# Patient Record
Sex: Female | Born: 1985 | Race: White | Hispanic: No | Marital: Married | Smoking: Never smoker
Health system: Southern US, Community
[De-identification: ages and names within clinical notes are randomized; demographics above are authoritative.]

## PROBLEM LIST (undated history)

## (undated) DIAGNOSIS — J382 Nodules of vocal cords: Secondary | ICD-10-CM

## (undated) DIAGNOSIS — Z8751 Personal history of pre-term labor: Secondary | ICD-10-CM

## (undated) DIAGNOSIS — K219 Gastro-esophageal reflux disease without esophagitis: Secondary | ICD-10-CM

## (undated) DIAGNOSIS — Z789 Other specified health status: Secondary | ICD-10-CM

## (undated) HISTORY — PX: TONSILLECTOMY: SUR1361

## (undated) HISTORY — DX: Nodules of vocal cords: J38.2

## (undated) HISTORY — PX: ADENOIDECTOMY: SUR15

## (undated) HISTORY — DX: Gastro-esophageal reflux disease without esophagitis: K21.9

---
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
## 2009-07-25 DIAGNOSIS — Z0000 Encounter for general adult medical examination without abnormal findings: Secondary | ICD-10-CM

## 2009-07-25 HISTORY — DX: Encounter for general adult medical examination without abnormal findings: Z00.00

## 2010-10-22 ENCOUNTER — Emergency Department
Admit: 2010-10-22 | Disposition: A | Discharge status: Home / Self Care | Payer: Self-pay | Source: Emergency Department | Admitting: Emergency Medicine

## 2010-10-29 ENCOUNTER — Emergency Department
Admit: 2010-10-29 | Disposition: A | Discharge status: Home / Self Care | Payer: Self-pay | Source: Emergency Department | Admitting: Emergency Medicine

## 2010-11-25 ENCOUNTER — Emergency Department
Admit: 2010-11-25 | Disposition: A | Discharge status: Home / Self Care | Payer: Self-pay | Source: Emergency Department | Admitting: Emergency Medicine

## 2012-03-20 ENCOUNTER — Encounter (INDEPENDENT_AMBULATORY_CARE_PROVIDER_SITE_OTHER): Payer: Self-pay | Admitting: Internal Medicine

## 2012-03-20 ENCOUNTER — Ambulatory Visit: Payer: Commercial Managed Care - PPO | Attending: Internal Medicine

## 2012-03-20 ENCOUNTER — Ambulatory Visit (INDEPENDENT_AMBULATORY_CARE_PROVIDER_SITE_OTHER): Payer: Commercial Managed Care - PPO | Admitting: Internal Medicine

## 2012-03-20 VITALS — BP 121/78 | HR 67 | Temp 98.3°F | Ht 62.0 in | Wt 137.0 lb

## 2012-03-20 DIAGNOSIS — K219 Gastro-esophageal reflux disease without esophagitis: Secondary | ICD-10-CM | POA: Insufficient documentation

## 2012-03-20 DIAGNOSIS — R1310 Dysphagia, unspecified: Secondary | ICD-10-CM

## 2012-03-20 MED ORDER — SUCRALFATE 1 G PO TABS
1.00 g | ORAL_TABLET | Freq: Four times a day (QID) | ORAL | Status: AC
Start: 2012-03-20 — End: 2013-03-20

## 2012-03-20 MED ORDER — ESOMEPRAZOLE MAGNESIUM 40 MG PO CPDR
40.00 mg | DELAYED_RELEASE_CAPSULE | Freq: Every morning | ORAL | Status: AC
Start: 2012-03-20 — End: 2013-03-20

## 2012-03-20 NOTE — Patient Instructions (Signed)
GERD (Adult)    The esophagus is a tube that carries food from the mouth to the stomach. A valve at the lower end of the esophagus prevents stomach acid from flowing upward. If this valve does not work properly, acid from the stomach enters the esophagus. If this occurs over and over, the acid will injure the lining of the esophagus.  This condition is called GERD (gastroesophageal reflux disease) or acid reflux. When stomach acid flows upward into the esophagus, it causes burning, pressure or sharp pain in the upper abdomen or mid to lower chest. The pain can spread to the neck, back, or shoulder, similar to heart pain (angina). There may be belching, an acid taste in the back of the throat, chronic cough, or sore throat or hoarseness. GERD symptoms often occur during the day after a big meal, but it can also occur at night when lying down. Smoking,as well as drinking alcohol, increases the risk of GERD.  GERD is a chronic condition. Once it begins, it is often lifelong. Treatment includes changes in eating habits and the use of acid blocker medications to decrease the amount of acid in the stomach.  Symptoms often improve with treatment, but if treatment is stopped, the symptoms usually return after a few months. So most persons with GERD will need to continue treatment.  HOME CARE:   Take the prescribed acid blocker medication for the full course of treatment even if you begin to feel better sooner. This medication can take up to several days to fully control your symptoms. If you can't afford the prescribed medication, you can try over-the-counter acid blockers, such as Pepcid AC, Tagamet, Zantac, or Aciphex. If these do not relieve your symptoms, a stronger acid-blocker can be tried, such as Prilosec OTC.   You can use antacids, such as Tums, Rolaids, Mylanta, or Maalox, for pain. This will be useful the first few days after starting acid blockers when the blockers haven't started working yet. Follow the  directions on the label. Liquid antacids may work better than tablets. Note that antacids can interfere with absorption of certain medications. Specifically, do not take Tagamet (cimetidine), Zantac (ranitidine), or Carafate (sucralfate) within 1 hour of taking an antacid. Talk with your pharmacist if you have any questions.   Limit or avoid fatty, fried, and spicy foods, as well as coffee, chocolate, mint, and foods with high acid content such as tomatoes and citrus fruit and juices (orange, grapefruit, lemon).   Avoid alcohol and smoking.   Don't eat large meals, especially at night. Frequent, smaller meals are best. Do not lie down right after eating. And don't eat anything 3 hours before going to bed.   If you are overweight, losing weight will reduce symptoms. Women should not wear corsets or girdles because this increases pressure on the stomach and worsens reflux.   If your symptoms occur during sleep, use a foam wedge to elevate your upper body (not just your head.) Or, place 4" blocks under the head of your bed.  FOLLOW UP with your doctor or as advised by our staff. Further testing may be needed. If you do not begin to improve over the next 4 days, contact your doctor. If you had an x-ray, CT scan, or ECG (electrocardiogram), it will be reviewed by a specialist. You'll be notified of any new findings that affect your care.  GET PROMPT MEDICAL ATTENTION if any of the following occur:   Stomach pain gets worse or moves to the  lower right abdomen (appendix area)   Chest pain appears or gets worse, or spreads to the back, neck, shoulder, or arm   Frequent vomiting (can't keep down liquids)   Blood in the stool or vomit (red or black in color)   Feeling weak or dizzy, fainting, or trouble breathing   Fever of 100.76F (38C) or higher, or as directed by your healthcare provider   [address], [address], [place], [place] [address]. All rights reserved. This information is not  intended as a substitute for professional medical care. Always follow your healthcare professional's instructions.

## 2012-03-20 NOTE — Progress Notes (Signed)
1. Have you self referred yourself since we last saw you? Yes, Dr Ned Card, OB/GYN.

## 2012-03-20 NOTE — Progress Notes (Signed)
Subjective:       Patient ID: [name] is a 26 y.o. female.    HPI Comments: Patient presents with:    Chest Pain - "tightness" when drinking/eating, x 2 weeks      The patient is a 26 years old female with a past medical history significant for GERD which started about 10 years ago and has been seen by gastroenterologist at that time in [place], [place] and had EGD done, she was treated with Nexium off and on until about the 2 years ago when she was slowly taken off of it and was doing fairly well.  According to patient she was also seen by ENT specialist at that time due to hoarseness of voice and noted to have a few nodule on the vocal cords but did not have to remove it and it was thought that her symptoms are probably due to severe reflux.     Nausea complaints of having difficulty with swallowing solid food like eating a sandwich over the last few weeks, Which has been slowly worsening and feels like food getting stuck behind the breast bone, there is no complaint of having any worsening heartburn over the last few months, having any problem at night, sour  taste in the mouth, Bloating sensations, belching or coughing up anything.  She occasionally complains of feeling tightness with drinking cold water or eating ice cream.   She has not lost any weight in states he has gained a few more pounds.   She denies drinking any alcohol or smoking or taking any over-the-counter NSAIDs, does not drink coffee or caffeinated drinks or orange juice.     She has not started taking her Nexium.   She denies any increased stress at work lately      The following portions of the patient's history were reviewed and updated as appropriate:   Past Medical History   Diagnosis Date   . Physical exam, annual 2011   . Vocal cord nodules        Past Surgical History   Procedure Date   . Esophagogastroduodenoscopy 2004     [place],        History     Social History   . Marital Status: Married     Spouse Name: N/A     Number of  Children: N/A   . Years of Education: N/A     Occupational History   . Not on file.     Social History Main Topics   . Smoking status: Never Smoker    . Smokeless tobacco: Never Used   . Alcohol Use: 1.0 oz/week     2 drink(s) per week   . Drug Use: No   . Sexually Active: Not on file     Other Topics Concern   . Not on file     Social History Narrative   . No narrative on file       Family History   Problem Relation Age of Onset   . Diabetes Father    . Hypertension Father    . Alcohol abuse Neg Hx    . Arthritis Neg Hx    . Asthma Neg Hx    . Birth defects Neg Hx    . Cancer Neg Hx    . COPD Neg Hx    . Depression Neg Hx    . Drug abuse Neg Hx    . Early death Neg Hx    . Hearing loss Neg  Hx    . Heart disease Neg Hx    . Hyperlipidemia Neg Hx    . Kidney disease Neg Hx    . Learning disabilities Neg Hx    . Mental retardation Neg Hx    . Miscarriages / Stillbirths Neg Hx    . Stroke Neg Hx    . Mental illness Neg Hx    . Vision loss Neg Hx        Allergies   Allergen Reactions   . Bactrim (Sulfamethoxazole W/Trimethoprim (Co-Trimoxazole)) Hives       Current Outpatient Prescriptions   Medication Status Sig Dispense Refill   . esomeprazole (NEXIUM) 40 MG capsule Active Take 1 capsule (40 mg total) by mouth every morning before breakfast.  30 capsule  2   . sucralfate (CARAFATE) 1 G tablet Active Take 1 tablet (1 g total) by mouth 4 (four) times daily.  120 tablet  2   . TRI-SPRINTEC 0.18/0.215/0.25 MG-35 MCG TABS Active            BP 121/78  Pulse 67  Temp(Src) 98.3 F (36.8 C) (Oral)  Ht 1.575 m (5\' 2" )  Wt 62.143 kg (137 lb)  BMI 25.06 kg/m2  LMP 03/19/2012  .    Review of Systems   Constitutional: Negative.    HENT: Negative.    Eyes: Negative.    Respiratory: Negative.    Cardiovascular: Negative for palpitations.        As per H&P   Gastrointestinal: Negative for nausea, abdominal pain, constipation, blood in stool and abdominal distention.        As per H&P   Genitourinary: Negative.     Musculoskeletal: Negative.    Skin: Negative.    Neurological: Negative.    Psychiatric/Behavioral: Negative.            Objective:    Physical Exam   Constitutional: She is oriented to person, place, and time. She appears well-developed and well-nourished. No distress.   HENT:   Head: Atraumatic.   Mouth/Throat: Oropharynx is clear and moist. No oropharyngeal exudate.   Eyes: Conjunctivae normal and EOM are normal. Pupils are equal, round, and reactive to light. No scleral icterus.   Neck: Normal range of motion. Neck supple. No JVD present. No tracheal deviation present. No thyromegaly present.   Cardiovascular: Normal rate, regular rhythm, normal heart sounds and intact distal pulses.    No murmur heard.  Pulmonary/Chest: Effort normal and breath sounds normal. No stridor. No respiratory distress. She has no wheezes. She has no rales. She exhibits no tenderness.   Abdominal: Soft. Bowel sounds are normal. She exhibits no distension and no mass. There is no tenderness. There is no rebound and no guarding.        Protuberant, no guarding or rigidity, no hepatosplenomegaly, no hernia   Genitourinary:        Not done as per patient   Musculoskeletal: She exhibits no edema and no tenderness.   Lymphadenopathy:     She has no cervical adenopathy.   Neurological: She is alert and oriented to person, place, and time. She has normal reflexes.   Skin: Skin is warm and dry.   Psychiatric: She has a normal mood and affect. Her behavior is normal. Judgment and thought content normal.         No previous records available  for review      Assessment:       1. Odynophagia  CBC and differential, Comprehensive metabolic  panel, XR Chest 2 Views, FL Modified Barium Swallow W Speech Therapy, sucralfate (CARAFATE) 1 G tablet, esomeprazole (NEXIUM) 40 MG capsule   2. GERD (gastroesophageal reflux disease)  CBC and differential, Comprehensive metabolic panel, XR Chest 2 Views, FL Modified Barium Swallow W Speech Therapy, sucralfate  (CARAFATE) 1 G tablet, esomeprazole (NEXIUM) 40 MG capsule           Plan:    We had a long discussion with patient and it appears that her symptoms are probably due to reflux esophagitis and either spasm or stricture.   We will resume her Nexium and also add Carafate 1 g before each meal.   We will check CBC and comprehensive metabolic panel.  We will check chest x-ray to rule out any other etiology.   We have given option to patient to be seen by gastroenterologist and have EGD done and if there is any stricture and can be dilated at the same time or to barium swallow study to evaluate if the stricture versus spasms.    She decided to go with the barium swallow study due to which we will check it.  She will followup in 2 weeks and if her symptoms are not any better then she will need EGD.   We have also discussed diet with patient at length and she will avoid any citric juices, caffeinated drinks or carbonated drinks, chocolate, and NSAID.   We have also advised patient if she does eat sandwich that she needs to remove the hard part of bread ( Edges)

## 2012-03-21 LAB — CBC AND DIFFERENTIAL
Atypical Lymphocytes %: 0 %
Baso(Absolute): 30 cells/uL (ref 0–200)
Basophils: 0.3 %
Eosinophils Absolute: 90 cells/uL (ref 15–500)
Eosinophils: 0.9 %
Hematocrit: 38.9 % (ref 35.0–45.0)
Hemoglobin: 13 g/dL (ref 11.7–15.5)
Lymphocytes Absolute: 2100 cells/uL (ref 850–3900)
Lymphocytes: 21 %
MCH: 30.3 pg (ref 27–33)
MCHC: 33.3 g/dL (ref 32–36)
MCV: 91 fL (ref 80–100)
MPV: 7.7 fL (ref 7.5–11.5)
Monocytes Absolute: 310 cells/uL (ref 200–950)
Monocytes: 3.1 %
Neutrophils Absolute: 7470 cells/uL (ref 1500–7800)
Neutrophils: 74.7 %
Platelets: 338 10*3/uL (ref 140–400)
RBC: 4.28 10*6/uL (ref 3.80–5.10)
RDW: 13.4 % (ref 11.0–15.0)
WBC: 10 10*3/uL (ref 3.8–10.8)

## 2012-03-21 LAB — COMPREHENSIVE METABOLIC PANEL
ALT: 25 U/L (ref 6–29)
AST (SGOT): 27 U/L (ref 10–30)
Albumin/Globulin Ratio: 1.7 (ref 1.0–2.5)
Albumin: 4.5 G/DL (ref 3.6–5.1)
Alkaline Phosphatase: 68 U/L (ref 33–115)
BUN: 12 MG/DL (ref 7–25)
Bilirubin, Total: 0.6 MG/DL (ref 0.2–1.2)
CO2: 20 mmol/L (ref 19–30)
Calcium: 9.3 MG/DL (ref 8.6–10.2)
Chloride: 103 mmol/L (ref 98–110)
Creatinine: 0.73 mg/dL (ref 0.50–1.10)
EGFR African American: 130 mL/min/{1.73_m2} (ref >=60)
EGFR: 112 mL/min/{1.73_m2} (ref >=60)
Globulin: 2.7 G/DL (ref 1.9–3.7)
Glucose: 78 MG/DL (ref 65–99)
Potassium: 4.1 mmol/L (ref 3.5–5.3)
Protein, Total: 7.2 G/DL (ref 6.1–8.1)
Sodium: 135 mmol/L (ref 135–146)

## 2012-03-29 ENCOUNTER — Telehealth (INDEPENDENT_AMBULATORY_CARE_PROVIDER_SITE_OTHER): Payer: Self-pay

## 2012-03-29 NOTE — Telephone Encounter (Signed)
Dr. Allena Katz authorized Prilosec 20mg  po qd #30 R 2, called into pt's insurance and preferred pharmacy

## 2012-03-30 ENCOUNTER — Ambulatory Visit
Admission: RE | Admit: 2012-03-30 | Discharge: 2012-03-30 | Disposition: A | Discharge status: Home / Self Care | Payer: Exclusive Provider Organization | Source: Ambulatory Visit | Attending: Internal Medicine | Admitting: Internal Medicine

## 2012-03-30 DIAGNOSIS — K219 Gastro-esophageal reflux disease without esophagitis: Secondary | ICD-10-CM | POA: Insufficient documentation

## 2012-03-30 MED ORDER — BARIUM SULFATE 60 % PO SUSP
20.00 mL | Freq: Once | ORAL | Status: AC | PRN
Start: 2012-03-30 — End: 2012-03-30
  Administered 2012-03-30: 20 mL via ORAL

## 2012-03-30 MED ORDER — BARIUM SULFATE 40% ORAL SUSPENSION
10.00 mL | Freq: Once | Status: AC | PRN
Start: 2012-03-30 — End: 2012-03-30
  Administered 2012-03-30: 10 mL via ORAL

## 2012-03-30 NOTE — SLP Eval Note (Signed)
[facility]  Speech and Language Therapy Evaluation     Videofluoroscopic Swallowing Study Report    Patient: [name]    MRN#: [id number]     Time Calculation:  Time In: 0930  Time Out: 1000      Referring Physician: Dr Allena Katz  Radiologist: Dr Jannetta Quint      Medical Diagnosis: Odynophagia [787.20]  GERD (gastroesophageal reflux disease) [530.81]    Chief Complaint: Globus sensation and dysphagia with solid. Pt reported feeling the food get stuck in the throat and the chest.    Past Medical/Surgical History:  Past Medical History   Diagnosis Date   . Physical exam, annual 2011   . Vocal cord nodules    . GERD (gastroesophageal reflux disease)       Past Surgical History   Procedure Date   . Esophagogastroduodenoscopy 2004     [place],          Behavior/Mental Status: Alert / oriented    Risks/benefits of evaluation discussed with patient/caregiver? Yes    Respiratory Status: WNL's    Nutrition: Oral    Diet prior to VFSS: Regular    Liquids Consistency: Thin liquids    Oral Motor Skills:   Impairments: WNL's     VFSS Results - Clinical Observations    Positioning: Upright    Plane of Reference: lateral    Presentations:      Liquids:   Thin:20 cc   Nectar:   Honey:   1 teaspoon rice cereal per oz liquid:   2 teaspoon rice cereal per oz liquid:   1 tablespoon rice cereal per oz liquid:     Puree: 5 tsp  Solid: 3 bites  Pill:  Other:     Oral Stage: WNL's    Pharyngeal Stage: Mild pharyngeal stasis    Esophageal Stage: Not assessed at this time    Airway Protection: WNL's    Compensatory Strategies Assessed: N/A    Patient/Family Education: completed        Assessment:     Pt presents with mild pharyngeal dysphagia characterized by mild reduced tongue base retraction yielding min residue at the level of the Valleculae. There was adequate Hyo-Laryngeal excursion along with optimal pharyngeal peristalsis. There was adequate Epiglottic retroflexion with every swallow. There was mild delay in Cricopharyngeal  opening ( suggestive of possible esophageal motility involvement) Mild trace of residue at the level of pyriform sinuses. Study revealed no aspiration or penetration with solid and thin liquids. If clinically warranted, Pt may need upper GI study ( esophagram and/ or EGD to further examine esophageal stage of swallow and determine the etiology of globus sensation and dysphagia with solid.           Plan:   Discussed with: Patient   Suggestions for feeding: upright   Suggestions for medication administration: whole pill / liquid assist   Diet Recommendations: regular   Liquid Recommendations: Thin liquids   Mode for liquids: straw/ cup   Precautions/Compensations: Safe swallowing precautions education, small bites and alternate solid and liquids for optimal pharyngeal clearance.    Presley Raddle MS [facility] SLP   03/30/2012 2:25 PM  [phone]     03/30/2012

## 2012-04-04 ENCOUNTER — Ambulatory Visit (INDEPENDENT_AMBULATORY_CARE_PROVIDER_SITE_OTHER): Payer: Commercial Managed Care - PPO | Admitting: Internal Medicine

## 2012-04-13 ENCOUNTER — Ambulatory Visit (INDEPENDENT_AMBULATORY_CARE_PROVIDER_SITE_OTHER): Payer: Self-pay | Admitting: Internal Medicine

## 2012-07-25 NOTE — L&D Delivery Note (Signed)
Delivery Note:    NSVD of live birth female  infant over intact perineum,  Weight 4 lb 2 oz (1871 g)     APGARs:   1 minute: 9    5 minute: 9   10 minute:     Infant grossly normal.  nuchal       Placenta - spontaneously delivered, intact, 3 vessels     Lacerations - Yes , Periurethral;1st , injected with xylocaine, no repair needed    Complications - none    EBL: 200cc    Anesthesia - Local     Patient tolerated procedure well.    Waldon Merl, MD  06/27/2013  10:51 PM

## 2013-01-23 LAB — GONOCOCCUS CULTURE
Chlamydia trachomatis Culture: NEGATIVE
Culture Gonorrhoeae: NEGATIVE

## 2013-01-23 LAB — ANTIBODY SCREEN: AB Screen Gel: NEGATIVE

## 2013-01-23 LAB — HEPATITIS B SURFACE ANTIGEN W/ REFLEX TO CONFIRMATION: Hepatitis B Surface Antigen: NEGATIVE

## 2013-01-23 LAB — RPR: RPR: NONREACTIVE

## 2013-01-23 LAB — HIV AG/AB 4TH GENERATION: HIV Ag/Ab, 4th Generation: NEGATIVE

## 2013-01-23 LAB — RUBELLA ANTIBODY, IGG: Rubella AB, IgG: IMMUNE

## 2013-06-22 ENCOUNTER — Inpatient Hospital Stay: Payer: Commercial Managed Care - POS | Admitting: Obstetrics & Gynecology

## 2013-06-22 ENCOUNTER — Observation Stay: Payer: Commercial Managed Care - POS

## 2013-06-22 ENCOUNTER — Inpatient Hospital Stay
Admission: AD | Admit: 2013-06-22 | Discharge: 2013-06-29 | DRG: 775 | Disposition: A | Discharge status: Home / Self Care | Payer: Commercial Managed Care - POS | Source: Ambulatory Visit | Attending: Obstetrics & Gynecology | Admitting: Obstetrics & Gynecology

## 2013-06-22 DIAGNOSIS — O4290 Premature rupture of membranes, unspecified as to length of time between rupture and onset of labor, unspecified weeks of gestation: Principal | ICD-10-CM | POA: Diagnosis present

## 2013-06-22 DIAGNOSIS — Z23 Encounter for immunization: Secondary | ICD-10-CM

## 2013-06-22 LAB — CBC AND DIFFERENTIAL
Basophils Absolute Automated: 0.02 (ref 0.00–0.20)
Basophils Automated: 0 %
Eosinophils Absolute Automated: 0.17 (ref 0.00–0.70)
Eosinophils Automated: 1 %
Hematocrit: 32.2 % — ABNORMAL LOW (ref 37.0–47.0)
Hgb: 10.8 g/dL — ABNORMAL LOW (ref 12.0–16.0)
Immature Granulocytes Absolute: 0.13 — ABNORMAL HIGH
Immature Granulocytes: 1 %
Lymphocytes Absolute Automated: 2.62 (ref 0.50–4.40)
Lymphocytes Automated: 19 %
MCH: 29.1 pg (ref 28.0–32.0)
MCHC: 33.5 g/dL (ref 32.0–36.0)
MCV: 86.8 fL (ref 80.0–100.0)
MPV: 10.1 fL (ref 9.4–12.3)
Monocytes Absolute Automated: 0.61 (ref 0.00–1.20)
Monocytes: 4 %
Neutrophils Absolute: 10.51 — ABNORMAL HIGH (ref 1.80–8.10)
Neutrophils: 76 %
Nucleated RBC: 0 (ref 0–1)
Platelets: 303 (ref 140–400)
RBC: 3.71 — ABNORMAL LOW (ref 4.20–5.40)
RDW: 12 % (ref 12–15)
WBC: 13.93 — ABNORMAL HIGH (ref 3.50–10.80)

## 2013-06-22 LAB — GROUP B STREP TRANSCRIBED: GBS Transcribed: NEGATIVE

## 2013-06-22 LAB — URINALYSIS, REFLEX TO MICROSCOPIC EXAM IF INDICATED
Bilirubin, UA: NEGATIVE
Blood, UA: NEGATIVE
Glucose, UA: NEGATIVE
Ketones UA: NEGATIVE
Nitrite, UA: NEGATIVE
Protein, UR: NEGATIVE
Specific Gravity UA: 1.003 (ref 1.001–1.035)
Urine pH: 7 (ref 5.0–8.0)
Urobilinogen, UA: NEGATIVE mg/dL

## 2013-06-22 LAB — TYPE AND SCREEN
AB Screen Gel: NEGATIVE
ABO Rh: A POS

## 2013-06-22 LAB — RUPTURE OF MEMBRANE AMNISURE: Rupture of Membrane AmniSure: POSITIVE — AB

## 2013-06-22 MED ORDER — AZITHROMYCIN 250 MG PO TABS
500.0000 mg | ORAL_TABLET | Freq: Once | ORAL | Status: AC
Start: 2013-06-22 — End: 2013-06-22
  Administered 2013-06-22: 500 mg via ORAL
  Filled 2013-06-22: qty 2

## 2013-06-22 MED ORDER — LACTATED RINGERS IV BOLUS
1000.0000 mL | Freq: Once | INTRAVENOUS | Status: AC
Start: 2013-06-22 — End: 2013-06-22
  Administered 2013-06-22: 1000 mL via INTRAVENOUS

## 2013-06-22 MED ORDER — BETAMETHASONE SOD PHOS & ACET 6 (3-3) MG/ML IJ SUSP
12.0000 mg | INTRAMUSCULAR | Status: DC
Start: 2013-06-22 — End: 2013-06-28
  Administered 2013-06-22: 30 mg via INTRAMUSCULAR
  Administered 2013-06-23: 12 mg via INTRAMUSCULAR
  Filled 2013-06-22: qty 5

## 2013-06-22 MED ORDER — CALCIUM GLUCONATE 10 % IV SOLN
1.0000 g | Freq: Once | INTRAVENOUS | Status: DC
Start: 2013-06-22 — End: 2013-06-28

## 2013-06-22 MED ORDER — AZITHROMYCIN 250 MG PO TABS
250.0000 mg | ORAL_TABLET | Freq: Every day | ORAL | Status: AC
Start: 2013-06-23 — End: 2013-06-26
  Administered 2013-06-23 – 2013-06-26 (×4): 250 mg via ORAL
  Filled 2013-06-22 (×4): qty 1

## 2013-06-22 MED ORDER — OXYTOCIN 30UNITS IN LR 500 ML PP--OUTSOURCED
INTRAVENOUS | Status: AC
Start: 2013-06-22 — End: 2013-06-23
  Filled 2013-06-22: qty 1000

## 2013-06-22 MED ORDER — SODIUM CHLORIDE 0.9 % IV MBP
3.0000 g | Freq: Once | INTRAVENOUS | Status: AC
Start: 2013-06-22 — End: 2013-06-22
  Administered 2013-06-22: 3 g via INTRAVENOUS
  Filled 2013-06-22: qty 100
  Filled 2013-06-22: qty 2000

## 2013-06-22 MED ORDER — MAGNESIUM SULFATE 40 GM/1000 ML BOLUS FROM BAG
6.0000 g | Freq: Once | INTRAMUSCULAR | Status: AC
Start: 2013-06-22 — End: 2013-06-22
  Administered 2013-06-22: 6 g via INTRAVENOUS

## 2013-06-22 MED ORDER — MAGNESIUM SULFATE 40 MG/ML IJ SOLN
3.0000 g/h | INTRAMUSCULAR | Status: DC
Start: 2013-06-22 — End: 2013-06-23
  Administered 2013-06-23: 3 g/h via INTRAVENOUS
  Filled 2013-06-22 (×2): qty 1000

## 2013-06-22 MED ORDER — SODIUM CHLORIDE 0.9 % IV MBP
1.5000 g | Freq: Four times a day (QID) | INTRAVENOUS | Status: DC
Start: 2013-06-22 — End: 2013-06-28
  Administered 2013-06-22 – 2013-06-27 (×20): 1.5 g via INTRAVENOUS
  Filled 2013-06-22 (×2): qty 100
  Filled 2013-06-22: qty 1000
  Filled 2013-06-22 (×3): qty 100
  Filled 2013-06-22 (×5): qty 1000
  Filled 2013-06-22 (×3): qty 100
  Filled 2013-06-22: qty 1000
  Filled 2013-06-22: qty 100
  Filled 2013-06-22 (×4): qty 1000
  Filled 2013-06-22: qty 100
  Filled 2013-06-22: qty 1000
  Filled 2013-06-22 (×3): qty 100
  Filled 2013-06-22: qty 1000
  Filled 2013-06-22: qty 100
  Filled 2013-06-22 (×2): qty 1000
  Filled 2013-06-22: qty 100
  Filled 2013-06-22: qty 1000
  Filled 2013-06-22: qty 100
  Filled 2013-06-22 (×4): qty 1000
  Filled 2013-06-22 (×4): qty 100
  Filled 2013-06-22: qty 1000
  Filled 2013-06-22: qty 100

## 2013-06-22 MED ORDER — LACTATED RINGERS IV SOLN
INTRAVENOUS | Status: DC
Start: 2013-06-22 — End: 2013-06-28

## 2013-06-22 NOTE — H&P (Addendum)
Patient is 27 year old female, G1P0 at [redacted]w[redacted]d who presents after noted gush of fluid at 0730 am.  Denies bleeding or cramping.  On presentation to L&D, noted contractions on tocos.  amniosure +.  Sonogram done showed singleton, vertex.  AFI about 16.  Cervix 3 cm long    PMH:  Unremarkable  PSH:  Unremarkable  Allergies:  Bactrim  Meds:  PNV  ObHx:  G1P0    PE:  WD, WN gravid female in NAD  Vitals stable, afebrile  Abd:  Soft, nt, gravid  Ext:  nt  Pelvic deferred given sonogram    Labs:  WBC 13.9    A/P:  [redacted]w[redacted]d  PROM:  Will send cultures and start antibiotics  Start MgSO4 and give steroids.  MFM consult

## 2013-06-22 NOTE — Consults (Signed)
Neonatologist Antenatal Consult Note    Patient: [name]  Date of Birth: June 23, 1986   DOA: 06/22/2013  MRN: [id number]   Age: 27 y.o.  Race: Unavailable [3]  Attending MD:      Donald Siva, MD  Consultant:        Mamie Nick, MD  Date of Consult:  06/22/2013, 7:28 PM    Significant Maternal History:     Estimated Date of Delivery: 08/08/13     OB History     Grav Para Term Preterm Abortions TAB SAB Ect Mult Living    1                 Chief Complaint   Patient presents with   . Rupture of Membranes Evaluation     Active Problems:   [redacted] weeks gestation of pregnancy    Past Medical History   Diagnosis Date   . Physical exam, annual 2011   . Vocal cord nodules    . GERD (gastroesophageal reflux disease)      Past Surgical History   Procedure Date   . Esophagogastroduodenoscopy 2004     [place],    . Tonsillectomy        Labs:     Blood Type:    ABO Rh   Date Value Range Status   06/22/2013 A POS   Final        Serologies:  No results found for this basename: rubellaabigg, rpr, hepbsag, hivab, gbs         Maternal/Fetal Condition:     Prenatal complications: PPROM    Cervical Exam:       FHT: Baseline Rate: 130 BPM    Ultrasounds:     Amniocentesis:  No      Rupture of membranes:  Rupture Date: 06/22/2013   Rupture Time: 7:30 AM   Rupture Type: Spontaneous    Fluid Color: Clear   Fluid Odor:        Medications:     Home Medications:   Prescriptions prior to admission   Medication Sig   . Prenatal Vit w/Fe-Methylfol-FA (PNV PO) Take by mouth.   . TRI-SPRINTEC 0.18/0.215/0.25 MG-35 MCG TABS        Labor Medications:               []  Antenatal Steroids       YES,  # of Doses:1, Date: 06/22/13    []  Diabetic Meds        []  Insulin        []  Glyburide    []  Antibiotics        []  Ampicililn        []  Gentamycin        []  Other    []  Antihypertensives      []  Magnesium Sulfate  []  Pitocin    [x]  Unasyn  [x]  Zithromax     Discussion:      Mortality rate:  approximately 2-4%   Estimated LOS: Approximately mother's due  date.   Delivery Room Management/ NICU admission   NICU course:   Respiratory Distress Syndrome (RDS), Apnea, Necrotizing Enterocolitis (NEC), Intraventricular Hemorrhage (IVH), Feeding Intolerance, Prolonged IV Nutrition,    IV access:   Umbilical Artery Catheter (UAC), Umbilical Venous Catheter (UVC), Peripherally Inserted Venous Catheter (PICC), Peripheral Intravenous Line (PIV)    Intubation, other respiratory support    Blood Transfusion(s) from blood bank source   Breastfeeding promoted and donor breast milk discussed   Long-term  neurodevelopmental complications:  Risk increases with decreasing gestational age at delivery    Questions addressed:  Parents given opportunity to ask questions and subsequently answered.      Family present: mother, father.    Translator present if needed.     Time spent with consultation:       [x]   0 - 20 mins      []   21 - 40 mins      []   41 - 55 mins    Mamie Nick, MD  06/22/2013 7:28 PM

## 2013-06-23 NOTE — Progress Notes (Signed)
Discussed with Dr Henrene Hawking that pt c/o  double vision and that contractions have decreased to 3 or less per hour.  MD ordered magnesium stopped for one hour then restart at 2gm/hr.

## 2013-06-23 NOTE — Progress Notes (Signed)
In to see pt

## 2013-06-23 NOTE — Progress Notes (Signed)
Ok to stop continuous fluids and saline lock IV per Dr. Henrene Hawking. IV site leaking, so removed IV so pt can take a shower at this time and will do evening NST when finished.

## 2013-06-23 NOTE — Progress Notes (Signed)
Patient resting quietly.  Still leaking some fluid  Contractions rare on MgSO4  Will receive second dose of steroid this afternoon  Continue antibiotics  Will stop MgSO4 this afternoon.    No s/sx Mg toxicity

## 2013-06-23 NOTE — Progress Notes (Signed)
Dr Henrene Hawking at bedside to assess pt; ordered Magnesium stopped and  To [name] monitor  And foley.  Pt may eat and monitor BID and PRN.

## 2013-06-23 NOTE — Plan of Care (Signed)
Discussed  With patient  And spouse  POC for magnesium r/t second dose of betamethasone,   Purpose of magnesium being for  Contractions and neuro protection,  Questions answered.

## 2013-06-23 NOTE — Progress Notes (Signed)
Patient reflexes have diminished from +2 to +1 overnight. Pt remains asymptomatic, denies headache, blurry vision, nausea/vomiting, and continues to have good urine output. Pt no longer feels contractions and they have spaced out drastically since beginning of shift. Consulted with charge nurse as to whether or not draw a magnesium level due to diminishing reflexes, she suggested to wait at this point because it is expected with magnesium and patient is completely asymptomatic of possible Mg toxicity.

## 2013-06-24 ENCOUNTER — Inpatient Hospital Stay (HOSPITAL_BASED_OUTPATIENT_CLINIC_OR_DEPARTMENT_OTHER): Payer: Commercial Managed Care - POS

## 2013-06-24 ENCOUNTER — Encounter: Payer: Self-pay | Admitting: Maternal and Fetal Medicine

## 2013-06-24 DIAGNOSIS — O42113 Preterm premature rupture of membranes, onset of labor more than 24 hours following rupture, third trimester: Secondary | ICD-10-CM

## 2013-06-24 HISTORY — DX: Preterm premature rupture of membranes, onset of labor more than 24 hours following rupture, third trimester: O42.113

## 2013-06-24 NOTE — Consults (Signed)
CONSULT NOTE    Date Time: 06/24/2013 8:51 AM  Patient Name: [name]  Requesting Physician: Bobbye Charleston, MD  Consulting Physician: Camelia Phenes, MD    Reason for Consultation: PROM    History of Presenting Illness:     Thank you for requesting a Maternal Fetal Medicine consultation for your patient Ms. [name]  due to rupture of membranes.  As you know she is 27 y.o. year old G1 P0 now at [redacted]w[redacted]d weeks gestation (Estimated Date of Delivery: 08/08/13). She was admitted on 11/29 with complaints of leakage of fluid. [place] scan on admission revealed an EFW of 1800 gm (14th centile) and AFI of 16 cm.  Pregnancy has been otherwise uncomplicated.    Blood pressure is  91/55 mm Hg.   afebrile     Past Obstetric History:     Obstetric History    G1   P0   T0   P0   A0   TAB0   SAB0   E0   M0   L0      Past Medical History:     Past Medical History   Diagnosis Date   . Physical exam, annual 2011   . Vocal cord nodules    . GERD (gastroesophageal reflux disease)    . Premature rupture of membranes, onset of labour after 24 hours, antepartum condition or complication, preterm, third trimester 06/24/2013     Past Surgical History:     Past Surgical History   Procedure Date   . Esophagogastroduodenoscopy 2004     [place],    . Tonsillectomy      Family History:     Family History   Problem Relation Age of Onset   . Diabetes Father    . Hypertension Father    . Alcohol abuse Neg Hx    . Arthritis Neg Hx    . Asthma Neg Hx    . Birth defects Neg Hx    . Cancer Neg Hx    . COPD Neg Hx    . Depression Neg Hx    . Drug abuse Neg Hx    . Early death Neg Hx    . Hearing loss Neg Hx    . Heart disease Neg Hx    . Hyperlipidemia Neg Hx    . Kidney disease Neg Hx    . Learning disabilities Neg Hx    . Mental retardation Neg Hx    . Miscarriages / Stillbirths Neg Hx    . Stroke Neg Hx    . Mental illness Neg Hx    . Vision loss Neg Hx      Social History:     History     Social History   . Marital Status: Married     Spouse Name: N/A      Number of Children: N/A   . Years of Education: N/A     Occupational History   . 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 Smoking status: Never Smoker    . Smokeless tobacco: Never Used   . Alcohol Use: 1.0 oz/week     2 drink(s) per week   . Drug Use: No   . Sexually Active: Not on file     Other Topics Concern   . Not on file     Social History Narrative    Married No childDoes do exercise     Allergies:  Allergies   Allergen Reactions   . Bactrim (Sulfamethoxazole W/Trimethoprim (Co-Trimoxazole)) Hives     Medications:     Current Facility-Administered Medications   Medication Dose Route Frequency Provider Last Rate Last Dose   . ampicillin-sulbactam (UNASYN) 1.5 g in sodium chloride 0.9 % 100 mL IVPB mini-bag plus  1.5 g Intravenous Q6H SCH Donald Siva, MD   1.5 g at 06/24/13 1610   . azithromycin (ZITHROMAX) tablet 250 mg  250 mg Oral Daily Donald Siva, MD   250 mg at 06/23/13 1253   . betamethasone acetate-betamethasone sodium phosphate (CELESTONE) injection 12 mg  12 mg Intramuscular Q24H Donald Siva, MD   12 mg at 06/23/13 1319   . calcium GLUConate 10 % injection 1 g  1 g Intravenous Once Donald Siva, MD       . lactated ringers infusion   Intravenous Continuous Donald Siva, MD 125 mL/hr at 06/24/13 [id number]     . [DISCONTINUED] magnesium sulfate 40g in SW infusion  3 g/hr Intravenous Continuous Donald Siva, MD   2 g/hr at 06/23/13 1020     Review of Systems:   Complaining of leakage of fluid (intermittent).  Denies uterine tenderness of bleeding  Good fetal movements.    Labs:     Results     Procedure Component Value Units Date/Time    Urine culture [[id number]] Collected:06/22/13 1157    Specimen Information:Urine / Urine, Clean Catch Updated:06/23/13 1816    Narrative:    ORDER#: [id number]                                    ORDERED BY: Bobbye Charleston  SOURCE: Urine, Clean Catch                           COLLECTED:  06/22/13 11:57  ANTIBIOTICS AT  COLL.:                                RECEIVED :  06/22/13 20:54  Culture Urine                              FINAL       06/23/13 18:16  06/23/13   1,000 - 9,000 CFU/ML of normal urogenital or skin microbiota, no further             work      Harrold Donath PCR [[id number]] Collected:06/22/13 1157    Specimen Information:Vaginal/Anal Swabs Updated:06/22/13 2212    Narrative:    ORDER#: [id number]                                    ORDERED BY: Bobbye Charleston  SOURCE: Vaginal/Anal Swabs                           COLLECTED:  06/22/13 11:57  ANTIBIOTICS AT COLL.:                                RECEIVED :  06/22/13 20:36  Group B Strep Intrapartum PCR  FINAL       06/22/13 22:12  06/22/13   Negative: Group B Strep target nucleic acid not detected                       Presumed not colonized with GBS             Test performed by Cepheid GeneXpert      Mycoplasma / ureaplasma culture [[id number]] Collected:06/22/13 1312    Specimen Information:Genital / Cervical Swab Updated:06/22/13 1318    Narrative:    Call Lab for Specimen Requirements    Type and screen [[id number]] Collected:06/22/13 1157    Specimen Information:Blood Updated:06/22/13 1248     ABO Rh A POS      AB Screen Gel NEG     UA, Reflex to Microscopic [[id number]]  (Abnormal) Collected:06/22/13 1157     Urine Type Clean Catch Updated:06/22/13 1233     Color, UA Colorless      Clarity, UA Clear      Specific Gravity UA 1.003      Urine pH 7.0      Leukocyte Esterase, UA Trace (A)      Nitrite, UA Negative      Protein, UR Negative      Glucose, UA Negative      Ketones UA Negative      Urobilinogen, UA Negative mg/dL      Bilirubin, UA Negative      Blood, UA Negative      RBC, UA 0 - 5      WBC, UA 0 - 5      Squamous Epithelial Cells, Urine 0 - 5      Urine Bacteria Rare     CBC with differential [[id number]]  (Abnormal) Collected:06/22/13 1157    Specimen Information:Blood / Blood Updated:06/22/13 1206     WBC 13.93 (H)      RBC 3.71 (L)       Hgb 10.8 (L) g/dL      Hematocrit 32.4 (L) %      MCV 86.8 fL      MCH 29.1 pg      MCHC 33.5 g/dL      RDW 12 %      Platelets 303      MPV 10.1 fL      Neutrophils 76 %      Lymphocytes Automated 19 %      Monocytes 4 %      Eosinophils Automated 1 %      Basophils Automated 0 %      Immature Granulocyte 1 %      Nucleated RBC 0      Neutrophils Absolute 10.51 (H)      Abs Lymph Automated 2.62      Abs Mono Automated 0.61      Abs Eos Automated 0.17      Absolute Baso Automated 0.02      Absolute Immature Granulocyte 0.13 (H)     RUPTURE OF MEMBRANE AMNISURE [[id number]]  (Abnormal) Collected:06/22/13 1112     Rupture of Membrane AmniSure Positive (A) Updated:06/22/13 1128        Ultrasound report:   Cephalic  AFI = 6.5 cm    Assessment/Plan:      I have explained to the patient the implications of premature rupture of membranes (PROM). There are no clinical or laboratory signs at present of underlying intrauterine infection.   An initial course of broad spectrum antibiotics is  indicated for 5-7 days to reduce neonatal morbidity.    Delivery is recommended by 34 weeks, since the risks of pregnancy prolongation outweigh the benefits after such gestational age cut-off. Of course, earlier delivery would be indicated for suspicion of chorioamnionitis (fetal tachycardia, maternal fever, uterine tenderness), non-reassuring fetal testing, or anhydramnios.     Milking of the umbilical cord at birth has been shown to lower the risks of intracranial hemorrhage and other neonatal complications.     Thank you again for your kind referral.  I spent 20 minutes of this 20-minute visit on face to face counseling of [name]  discussing the above listed issues and for coordination of her care.  If you have questions, please do not hesitate to call me.     Sincerely,    Signed by: Camelia Phenes, MD

## 2013-06-24 NOTE — Progress Notes (Signed)
Patient without complaints  Vitals stable, afebrile  Abd: soft, nt, gravid  Ext:  nt  Pelvic:  Leaking fluid    A/P:  [redacted]w[redacted]d  PPPROM:  No s/sx chorio  MFM consult complete.  To repeat AFT in 3 days.    NST reactive.

## 2013-06-25 MED ORDER — PRENATAL AD PO TABS
1.0000 | ORAL_TABLET | Freq: Every day | ORAL | Status: DC
Start: 2013-06-25 — End: 2013-06-28
  Administered 2013-06-25 – 2013-06-26 (×2): 1 via ORAL
  Filled 2013-06-25 (×2): qty 1

## 2013-06-25 NOTE — Progress Notes (Signed)
12/2/20149:01 AM    Patient: [name], [name]    Blood pressure 103/53, pulse 88, temperature 98.3 F (36.8 C), temperature source Oral, resp. rate 16, weight 76.204 kg (168 lb), last menstrual period 11/07/2011, SpO2 99.00%, currently breastfeeding.    FHR:   145 +LTV, reactive    Ctx:   none    Cervix:   n/a    Assessment:   [redacted]w[redacted]d weeks IUP  PPROM  MFM consult following  S/p Beta methasone    Plan:    Continue bedrest and in hospital management    Cristy Hilts, MD

## 2013-06-26 LAB — TYPE AND SCREEN
AB Screen Gel: NEGATIVE
ABO Rh: A POS

## 2013-06-26 LAB — CBC AND DIFFERENTIAL
Basophils Absolute Automated: 0.02 (ref 0.00–0.20)
Basophils Automated: 0 %
Eosinophils Absolute Automated: 0.23 (ref 0.00–0.70)
Eosinophils Automated: 1 %
Hematocrit: 32.5 % — ABNORMAL LOW (ref 37.0–47.0)
Hgb: 10.8 g/dL — ABNORMAL LOW (ref 12.0–16.0)
Immature Granulocytes Absolute: 0.23 — ABNORMAL HIGH
Immature Granulocytes: 1 %
Lymphocytes Absolute Automated: 3 (ref 0.50–4.40)
Lymphocytes Automated: 18 %
MCH: 28.8 pg (ref 28.0–32.0)
MCHC: 33.2 g/dL (ref 32.0–36.0)
MCV: 86.7 fL (ref 80.0–100.0)
MPV: 10.1 fL (ref 9.4–12.3)
Monocytes Absolute Automated: 0.78 (ref 0.00–1.20)
Monocytes: 5 %
Neutrophils Absolute: 12.7 — ABNORMAL HIGH (ref 1.80–8.10)
Neutrophils: 76 %
Nucleated RBC: 0 (ref 0–1)
Platelets: 296 (ref 140–400)
RBC: 3.75 — ABNORMAL LOW (ref 4.20–5.40)
RDW: 13 % (ref 12–15)
WBC: 16.73 — ABNORMAL HIGH (ref 3.50–10.80)

## 2013-06-26 NOTE — Progress Notes (Signed)
ANTEPARTUM PROGRESS NOTE    Date Time: 06/26/2013 1:10 PM LOS:4   Room#A2006/A2006-01    Principal Problem:   Patient is a 27 y.o., @ 107w6d with PPROM.  OB History     Grav Para Term Preterm Abortions TAB SAB Ect Mult Living    1                    Subjective:   Denies Vaginal Bleeding, Contractions, but continue with leakage of clear fluids.   Reports good fetal movement.    Objective:   Patient Vitals for the past 24 hrs:   BP Temp Temp src Pulse Resp   06/26/13 0930 112/58 mmHg 98.1 F (36.7 C) Oral 106  16    06/26/13 0600 79/39 mmHg 98.1 F (36.7 C) Oral 73  16    06/26/13 0000 - 98.1 F (36.7 C) Oral - 16    06/25/13 1931 100/55 mmHg 98.2 F (36.8 C) Oral 76  17      Fetal Heart Baseline Rate: 140 BPM  Weight: 76.204 kg (168 lb)  Weight change:     General appearance - alert, well appearing, and in no distress and oriented to person, place, and time  Mental status - alert, oriented to person, place, and time, normal mood, behavior, speech, dress, motor activity, and thought processes  Chest - clear to auscultation, no wheezes, rales or rhonchi, symmetric air entry, no tachypnea, retractions or cyanosis  Heart - normal rate, regular rhythm, normal S1, S2, no murmurs, rubs, clicks or gallops  Abdomen - soft, nontender, nondistended, no masses or organomegaly  no rebound tenderness noted  Breasts - not examined  Pelvic - examination not indicated  Rectal - rectal exam not indicated  Back exam - not examined  Extremities - feet normal, good pulses, normal color, temperature and sensation, no edema, redness or tenderness in the calves or thighs, Homan's sign negative bilaterally  Skin - normal coloration and turgor, no rashes, no suspicious skin lesions noted    Last Cervical Exam:       Current Labs     Lab Results   Component Value Date    ABORH A POS 06/22/2013     Results     Procedure Component Value Units Date/Time    CBC and differential [[id number]]  (Abnormal) Collected:06/26/13 1159    Specimen  Information:Blood / Blood Updated:06/26/13 1225     WBC 16.73 (H)      RBC 3.75 (L)      Hgb 10.8 (L) g/dL      Hematocrit 11.9 (L) %      MCV 86.7 fL      MCH 28.8 pg      MCHC 33.2 g/dL      RDW 13 %      Platelets 296      MPV 10.1 fL      Neutrophils 76 %      Lymphocytes Automated 18 %      Monocytes 5 %      Eosinophils Automated 1 %      Basophils Automated 0 %      Immature Granulocyte 1 %      Nucleated RBC 0      Neutrophils Absolute 12.70 (H)      Abs Lymph Automated 3.00      Abs Mono Automated 0.78      Abs Eos Automated 0.23      Absolute Baso Automated 0.02  Absolute Immature Granulocyte 0.23 (H)     Type and Screen [[id number]] Collected:06/26/13 1159    Specimen Information:Blood Updated:06/26/13 1220    CBC WITH MANUAL DIFFERENTIAL [[id number]] Collected:06/26/13 1159     Updated:06/26/13 1159        @FLOW [[id number]]@    Recent Sono:   Date:     EFW:   %ile   AC:    S/D:    MCA:   MoM   AFI/MVP:     Position:    Placenta:    Cervical Length:    cm     Antenatal Testing:   NON-STRESS TEST:     Baseline (bpm): 140 BPM   Variability: Moderate   Pattern: Accelerations;Variable decelerations   Accelerations: 15 bpm x 15 sec   Fetal Activity: Present   Uterine Activity: None noted   Resting Tone Palpated: Soft   Non Stress Test Interpretation: Reactive (06/25/13 2000)         BPP:       Meds   Did the patient receive 2 doses of AP steroids administered?: No        Current Facility-Administered Medications   Medication Dose Route Frequency Provider Last Rate Last Dose   . ampicillin-sulbactam (UNASYN) 1.5 g in sodium chloride 0.9 % 100 mL IVPB mini-bag plus  1.5 g Intravenous Q6H SCH Donald Siva, MD   1.5 g at 06/26/13 1156   . [COMPLETED] azithromycin (ZITHROMAX) tablet 250 mg  250 mg Oral Daily Donald Siva, MD   250 mg at 06/26/13 1027   . betamethasone acetate-betamethasone sodium phosphate (CELESTONE) injection 12 mg  12 mg Intramuscular Q24H Donald Siva, MD   12 mg at 06/23/13 1319    . calcium GLUConate 10 % injection 1 g  1 g Intravenous Once Donald Siva, MD       . lactated ringers infusion   Intravenous Continuous Donald Siva, MD 125 mL/hr at 06/24/13 [phone]     . PRENATAL vitamin tablet 1 tablet  1 tablet Oral Daily Hibshman, Anda Kraft, MD   1 tablet at 06/25/13 1946       Assessment        Patient Active Problem List   Diagnosis   . GERD (gastroesophageal reflux disease)   . [redacted] weeks gestation of pregnancy   . Premature rupture of membranes, onset of labour after 24 hours, antepartum condition or complication, preterm, third trimester   . PROM (premature rupture of membranes)        Oligohydramnios-- AFI-6    Plan   Re-eval by MFM  To consider delivery//CBC today

## 2013-06-27 ENCOUNTER — Ambulatory Visit: Payer: Self-pay

## 2013-06-27 MED ORDER — OXYTOCIN 30 UNITS IN LR 500 ML LABOR -OUTSOURCED
4.0000 m[IU]/min | INTRAVENOUS | Status: DC
Start: 2013-06-27 — End: 2013-06-28
  Administered 2013-06-27: 10 m[IU]/min via INTRAVENOUS

## 2013-06-27 MED ORDER — OXYCODONE-ACETAMINOPHEN 5-325 MG PO TABS
1.0000 | ORAL_TABLET | Freq: Once | ORAL | Status: AC | PRN
Start: 2013-06-27 — End: 2013-06-27
  Administered 2013-06-27: 1 via ORAL
  Filled 2013-06-27: qty 1

## 2013-06-27 MED ORDER — NALBUPHINE HCL 10 MG/ML IJ SOLN
10.0000 mg | Freq: Once | INTRAMUSCULAR | Status: AC
Start: 2013-06-27 — End: 2013-06-27
  Administered 2013-06-27: 10 mg via INTRAVENOUS
  Filled 2013-06-27: qty 2

## 2013-06-27 MED ORDER — LACTATED RINGERS IV SOLN
INTRAVENOUS | Status: DC
Start: 2013-06-27 — End: 2013-06-29

## 2013-06-27 MED ORDER — IBUPROFEN 600 MG PO TABS
600.0000 mg | ORAL_TABLET | Freq: Once | ORAL | Status: AC | PRN
Start: 2013-06-27 — End: 2013-06-27
  Administered 2013-06-27: 600 mg via ORAL
  Filled 2013-06-27: qty 1

## 2013-06-27 MED ORDER — OXYTOCIN 30UNITS IN LR 500 ML PP--OUTSOURCED
7.5000 [IU]/h | INTRAVENOUS | Status: DC
Start: 2013-06-27 — End: 2013-06-28
  Administered 2013-06-27: 7.5 [IU]/h via INTRAVENOUS
  Filled 2013-06-27: qty 500

## 2013-06-27 MED ORDER — LIDOCAINE HCL 2 % IJ SOLN
INTRAMUSCULAR | Status: AC
Start: 2013-06-27 — End: 2013-06-27
  Filled 2013-06-27: qty 20

## 2013-06-27 MED ORDER — NALBUPHINE HCL 10 MG/ML IJ SOLN
10.0000 mg | Freq: Once | INTRAMUSCULAR | Status: AC
Start: 2013-06-27 — End: 2013-06-27
  Administered 2013-06-27: 10 mg via INTRAMUSCULAR
  Filled 2013-06-27: qty 1

## 2013-06-27 MED ORDER — LACTATED RINGERS IV BOLUS
500.0000 mL | Freq: Once | INTRAVENOUS | Status: DC
Start: 2013-06-27 — End: 2013-06-28

## 2013-06-27 MED ORDER — OXYTOCIN 30UNITS IN LR 500 ML PP--OUTSOURCED
INTRAVENOUS | Status: AC
Start: 2013-06-27 — End: 2013-06-27
  Administered 2013-06-27: 4 m[IU]/min via INTRAVENOUS
  Filled 2013-06-27: qty 1000

## 2013-06-27 NOTE — Progress Notes (Signed)
LABOR CHECK    VITALS:    Filed Vitals:    06/27/13 1730   BP: 111/64   Pulse: 76   Temp: 97.7 F (36.5 C)   Resp: 18   SpO2:        The fetal heart tracing showed: Baseline Rate: 150 BPM, Variability: Moderate, Pattern: Accelerations, FHR Category: Category I    CERVICAL EXAM:        FHR Category: Category I    Contraction Frequency: x1, mild and < 40 seconds    Membrane Status: Premature    Fluid: Clear    Dilation: 3    Effacement (%): 100    Station: -1    Pitocin at 18 mu/min    Pain management: none    Impression:  IUP at term, latent labor, progressing slowly    Plan:  Continue expectant management.          Waldon Merl, MD  06/27/2013  8:02 PM

## 2013-06-27 NOTE — Progress Notes (Signed)
12/4/20141:34 PM    Patient: [name], [name]    Blood pressure 116/64, pulse 95, temperature 98.3 F (36.8 C), temperature source Oral, resp. rate 18, weight 76.204 kg (168 lb), last menstrual period 11/07/2011, SpO2 99.00%, currently breastfeeding.    FHR: 140's, +accels    Ctx: Contraction Frequency: 2-6    Cervix: Dilation: 2 Effacement (%): 70 Station: -2 Membrane Status: Premature Color: Clear    Assessment:   [redacted]w[redacted]d weeks IUP  PPPROM    Plan:    Continue pitocin    Donald Siva, MD

## 2013-06-27 NOTE — Progress Notes (Signed)
Pitocin stopped per md order to give receptors a chance to flush, instructions to start at half dose (10) in 30 minutes

## 2013-06-27 NOTE — Progress Notes (Signed)
Patient without complaints.  Afebrile  34 0/7 weeks  PPPROM  S/p steroids and MgSO4  Will proceed with ripening and induction.

## 2013-06-27 NOTE — Progress Notes (Signed)
Forebag ruptured 

## 2013-06-28 LAB — CBC
Hematocrit: 29.6 % — ABNORMAL LOW (ref 37.0–47.0)
Hgb: 9.8 g/dL — ABNORMAL LOW (ref 12.0–16.0)
MCH: 28.4 pg (ref 28.0–32.0)
MCHC: 33.1 g/dL (ref 32.0–36.0)
MCV: 85.8 fL (ref 80.0–100.0)
MPV: 10.1 fL (ref 9.4–12.3)
Nucleated RBC: 0 (ref 0–1)
Platelets: 308 (ref 140–400)
RBC: 3.45 — ABNORMAL LOW (ref 4.20–5.40)
RDW: 13 % (ref 12–15)
WBC: 22.41 — ABNORMAL HIGH (ref 3.50–10.80)

## 2013-06-28 MED ORDER — LANOLIN EX OINT
TOPICAL_OINTMENT | CUTANEOUS | Status: DC | PRN
Start: 2013-06-28 — End: 2013-06-29
  Filled 2013-06-28: qty 7

## 2013-06-28 MED ORDER — SIMETHICONE 80 MG PO CHEW
80.0000 mg | CHEWABLE_TABLET | Freq: Four times a day (QID) | ORAL | Status: DC | PRN
Start: 2013-06-28 — End: 2013-06-29

## 2013-06-28 MED ORDER — TETANUS-DIPHTH-ACELL PERTUSSIS 5-2.5-18.5 LF-MCG/0.5 IM SUSP
0.5000 mL | INTRAMUSCULAR | Status: AC | PRN
Start: 2013-06-28 — End: 2013-06-29
  Administered 2013-06-29: 0.5 mL via INTRAMUSCULAR
  Filled 2013-06-28 (×2): qty 0.5

## 2013-06-28 MED ORDER — ONDANSETRON HCL 4 MG PO TABS
4.0000 mg | ORAL_TABLET | Freq: Three times a day (TID) | ORAL | Status: DC | PRN
Start: 2013-06-28 — End: 2013-06-29

## 2013-06-28 MED ORDER — WITCH HAZEL-GLYCERIN EX PADS
1.0000 | MEDICATED_PAD | CUTANEOUS | Status: DC | PRN
Start: 2013-06-28 — End: 2013-06-29
  Administered 2013-06-28: 1 via TOPICAL
  Filled 2013-06-28: qty 40

## 2013-06-28 MED ORDER — PRENATAL AD PO TABS
1.0000 | ORAL_TABLET | Freq: Every day | ORAL | Status: DC
Start: 2013-06-28 — End: 2013-06-29
  Administered 2013-06-28: 1 via ORAL

## 2013-06-28 MED ORDER — OXYTOCIN 30UNITS IN LR 500 ML PP--OUTSOURCED
7.5000 [IU]/h | INTRAVENOUS | Status: AC
Start: 2013-06-28 — End: 2013-06-28

## 2013-06-28 MED ORDER — BENZOCAINE-MENTHOL 20-0.5 % EX AERO
1.0000 | INHALATION_SPRAY | CUTANEOUS | Status: DC | PRN
Start: 2013-06-28 — End: 2013-06-29
  Administered 2013-06-28: 1 via TOPICAL
  Filled 2013-06-28: qty 56

## 2013-06-28 MED ORDER — SENNOSIDES-DOCUSATE SODIUM 8.6-50 MG PO TABS
1.0000 | ORAL_TABLET | Freq: Every evening | ORAL | Status: DC | PRN
Start: 2013-06-28 — End: 2013-06-29
  Administered 2013-06-28: 1 via ORAL
  Filled 2013-06-28: qty 1

## 2013-06-28 MED ORDER — HYDROCORTISONE 1 % EX OINT
TOPICAL_OINTMENT | Freq: Three times a day (TID) | CUTANEOUS | Status: DC | PRN
Start: 2013-06-28 — End: 2013-06-29
  Filled 2013-06-28: qty 28.35

## 2013-06-28 MED ORDER — CALCIUM CARBONATE ANTACID 500 MG PO CHEW
1000.0000 mg | CHEWABLE_TABLET | Freq: Three times a day (TID) | ORAL | Status: DC | PRN
Start: 2013-06-28 — End: 2013-06-29

## 2013-06-28 MED ORDER — OXYCODONE-ACETAMINOPHEN 5-325 MG PO TABS
2.0000 | ORAL_TABLET | ORAL | Status: DC | PRN
Start: 2013-06-28 — End: 2013-06-29

## 2013-06-28 MED ORDER — IBUPROFEN 600 MG PO TABS
600.0000 mg | ORAL_TABLET | Freq: Four times a day (QID) | ORAL | Status: DC
Start: 2013-06-28 — End: 2013-06-29
  Administered 2013-06-28 – 2013-06-29 (×5): 600 mg via ORAL
  Filled 2013-06-28 (×5): qty 1

## 2013-06-28 MED ORDER — MISOPROSTOL 200 MCG PO TABS
800.0000 ug | ORAL_TABLET | Freq: Once | ORAL | Status: DC
Start: 2013-06-28 — End: 2013-06-28

## 2013-06-28 MED ORDER — MEASLES, MUMPS & RUBELLA VAC SC INJ
0.5000 mL | INJECTION | SUBCUTANEOUS | Status: DC | PRN
Start: 2013-06-28 — End: 2013-06-29
  Filled 2013-06-28: qty 0.5

## 2013-06-28 NOTE — Progress Notes (Signed)
PP DAY #1    Pt without complaint. Pain well controlled with PO pain meds. Tolerating regular diet, ambulating and voiding without difficulty. Minimal lochia rubra. Breastfeeding.    Blood pressure 100/57, pulse 83, temperature 97.5 F (36.4 C), temperature source Oral, resp. rate 18, weight 76.204 kg (168 lb), last menstrual period 11/07/2011, SpO2 99.00%, currently breastfeeding.      Gen: NAD  Abd: S, NT, ND     FF/NT @U   Ext: trace edema, nontender, no erythema    CBC    Lab 06/28/13 0624   WBC 22.41*   HGB 9.8*   HCT 29.6*   PLT 308               A: PP #1     P: ambulate  Lactation consult  Iron supplement  Routine postop/postpartum care      Waldon Merl, MD  06/28/2013  11:52 AM

## 2013-06-28 NOTE — Progress Notes (Signed)
Reason of Consult: Initial Assessment/ NICU MOM    Emergency Point of Contact: [place], [facility]- [phone]) Judie [phone]) father                                                  Andalyn, [phone]) [phone]) mother    Assessment: Writer met w/ MOB and FOB while at bedside to conduct and complete an initial assessment screening.  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  MOB described appropriate family support and FOB involvement. FOB plans to add infant to his insurance plan. MOB denied any mental health or substance abuse diagnoses during this consult. Infant will be followed by [facility] for pediatric care, and MOB will breast feed. They have adequate supplies needed to execute infant's d/c when medically cleared. There are no presenting concerns at this juncture from a clinical perspective.    D.C Treatment Plan: MOB and FOB were informed of the upcoming baby shower scheduled on 07/03/13. No other concerns noted.

## 2013-06-28 NOTE — Progress Notes (Signed)
Dr. Ledon Snare notified of elevated WBC, no orders given.

## 2013-06-28 NOTE — Plan of Care (Signed)
Problem: Breast Care  Goal: Infant receives adequate nutrition  Intervention: Pump breasts every 2-3 hours to increase stimulation or 8-12 times within 24 hours  In room initial visit. Mom is a P1 with a baby boy named "[name]." Mom stated that she has pumped several times and she was able to take colostrum to her baby in the NICU. [name] was born at [redacted] weeks gestation and is receiving care in the NICU. Reviewed pumping guidelines with Mom. Mom was given 2 information sheets to use to be able to track the number of times that she is pumping and a guide that indicates approximate amounts that breastfeeding Moms produce on a day-by- day basis.Information on the rental of a hospital grade pump was discussed. Mom plans to rent a pump on the day of D/C.Encouraged mom to call RN/LC as need for breastfeeding questions and/or concerns.

## 2013-06-29 LAB — MYCOPLASMA / UREAPLASMA CULTURE

## 2013-06-29 MED ORDER — IBUPROFEN 600 MG PO TABS
600.0000 mg | ORAL_TABLET | Freq: Four times a day (QID) | ORAL | Status: DC
Start: 2013-06-29 — End: 2015-01-23

## 2013-06-29 NOTE — Discharge Instructions (Signed)
Barry S. Rothman, M.D., F.A.C.O.G.                                                                       [address], [address]    Alice M. McKnight, M.D., F.A.C.O.G.                                                                         [place], [place]  [address]  Marc E. Siegel, M.D., F.A.C.O.G.                                                                                                 Phone [phone]  Sonia J. Salgado, M.D., F.A.C.O.G.                                                                                                  Fax [phone]  Natlie Asfour K. Cristianna Cyr, M.D., F.A.C.O.G.                                                                                                                                                  OBSTETRICS * GYNECOLOGY * INFERTILITY                                                                                                                                       POSTPARTUM INSTRUCTIONS    Congratulations on the birth of you new baby!  We hope the following information will help you during the first few weeks after you return home.  Please call our office soon after you are settled to make an appointment for approximately 6 weeks from the date of your delivery.    POST OP CARE  If you had a cesarean section, and staples were used to close your incision, then you need to call our office ASAP and make an appointment to be seen in 5-7 days. A staple remover and steri-strips will be given to you by the nurse on the day of discharge. Bring them to the office for your post op visit to have your staples removed. If you have steri-strips on your incision and staples were not used, then you can take them off in 5 days in the shower.     POST PARTUM APPOINTMENT  Please call our office within 24-48 hours after delivery to schedule your postpartum appointment. Waiting to schedule this appointment may result in us not being able to accommodate your availability and  the appointment would then be a well woman visit as the time period for a postpartum appointment will have passed.       ACTIVITY  Slowly increase your activity level when you arrive home.  If you must climb stairs, go slowly and be careful.  Do not lift anything heavier than your baby for the first week, or if you had a C-section, for four weeks.  You may rest or take short walks outside.    Driving should be prohibited for at least one week, and three weeks if you had a C-section.  Make sure you get as much rest as possible with frequent naps during the day    Showers are preferable to tub baths.  If your episiotomy stitches are painful, sitting in shallow (four inches) water such as a sitz bath may help reduce pain and swelling.  Make sure your postpartum nurse gives you instructions on episiotomy stitches care.  If you had a C-section, gently clean your incision each time you shower and pat dry.  Do not use soap on your incision.    You may begin postpartum exercises when you get home.  If you had a C-section, wait three weeks before starting the program.    BREAST CARE    A well-fitting and supportive bra should be worn day and night until the breasts return to normal size.  This will help to prevent stretching of ligaments.      If you are breast-feeding, drink as much fluid, especially water, as possible.  Avoid excessive caffeine and alcohol, and eat a well balanced diet.  Your uterus will contract while breast-feeding, and you may have heavier bleeding during feeding.  Advil or Motrin may be used for the pain relief.  Air dry your nipples after feeding.  Breast pads may be used in your bra to absorb any leaking.  If you notice breast redness, soreness and/or   have a temperature over 101F- CALL US IMMEDIATELY - you may have an infection.  If you are not breast-feeding, you should wear a bra at all times.  If your breasts become engorged with milk, wear a tight bra and apply ice packs.  Take Tylenol or  Motrin for the discomfort.    VAGINAL PERINEAL CARE  Change pads often and with each voiding.  The bleeding will gradually turn dark brown, then yellow and   may last up to six weeks.  DO NOT USE TAMPONS!  After urinating or moving your bowels, cleanse your episiotomy area with water, and gently dry, wiping from front to back.    If you are not breast-feeding, you may have a menstrual period as early as four weeks after delivery, but often it will take six to eight weeks for menses to resume.  If you are breast-feeding, it may be several months before you will resume menstruation, or not at all while breast-feeding.    DO NOT HAVE SEXUAL INTERCOURSE until after your postpartum visit.  Please review literature on various methods of contraception so you may be prepared to discuss your options at your postpartum visit.    MEDICATIONS  Medications allowed for postpartum (breast-feeding included):   Plain Tylenol or Extra-strength Tylenol for headaches.   Mylanta for acid indigestion   Anusol or Preparation H for hemorrhoids   Prenatal Vitamins to be continued for three months after delivery; for     breast-feeding mothers as long as you are nursing.   Peri-Colace, Colace, Senokot, Metamucil for constipation. Any other    medications must be approved by your pediatrician if you are breast    feeding.   Advil or Motrin for uterine contractions   Iron - if you were on iron supplements prior to delivery, continue taking it for    3 months.    CIRCUMCISION CHECK  If a circumcision was done on your baby boy, the pediatrician can check the baby at his one week appointment to check the healing process.    CALL THE OFFICE IF YOU HAVE:   CHILLS OR FEVER ABOVE 101F   HEAVY BLEEDING (changing pads every thirty minutes for two hours)   PAIN WITH URINATION   SEVERE PAIN OR REDNESS OF THE BREASTS   REDNESS, SWELLING AND/OR PUS FROM THE C-SECTION INCISION   DIZZINESS OR FAINTING,   SEVERE ABDOMINAL PAIN OR VOMITTING

## 2013-06-29 NOTE — Plan of Care (Signed)
Patient is stable post vaginal delivery day 2.  Her infant is a NICU baby in the feeder/grower room.  Patient is actively involved in the care of her infant and will discharge to home today herself.

## 2013-06-29 NOTE — Progress Notes (Signed)
Patient is Rubella Immune per prenatal record.  Therefore, does not require MMR vaccine.

## 2013-06-29 NOTE — Progress Notes (Signed)
Patient discharged as stable post-vaginal delivery day 2.  Infant is in NICU.  Patient and spouse verbalize understanding of postpartum instructions and their follow-up with their infant in the NICU

## 2013-06-29 NOTE — Plan of Care (Signed)
Problem: Cesarean and Vaginal Delivery-Postpartum Care  Goal: Vital signs and assessments are within defined parameters.  Intervention: Monitor/assess vital signs  Patient's vital signs were within normal limits.  Patient is to be discharged today.

## 2013-06-29 NOTE — Discharge Summary (Signed)
Obstetrical Discharge Form    Primary OB Clinician:  Tobin Chad of  [facility]    Gestational Age: [redacted]w[redacted]d, Estimated Date of Delivery: 08/08/13    Antepartum complications: prolonged rupture of membranes  Admission Diagnosis: <principal problem not specified>    Date of Delivery: 06/27/2013    Time of Delivery: 10:34 PM      Delivered By: Waldon Merl     Delivery Type: spontaneous vaginal    Tubal Ligation: n/a    Baby: Liveborn Female ; Birth weight: 4 lb 1.6 oz (1860 g) ; Apgar 1 minute: 9  Apgar 5 minute: 9 ;                                                             Anesthesia: Local anesthesia 1% buffered lidocaine    Delivery complications: Preterm Prolonged Rupture of memebranes, s/p steroids    Episiotomy: None   Laceration Type:  Periurethral;1st     Placenta: 06/27/2013 , 10:36 PM         Spontaneous , Intact         Cord:  3 vessels     Hospital Course : [name] is a 27 y.o. with <principal problem not specified>. She was admitted to the hospital for <principal problem not specified>. Her antepartum course was complicated by PPROM.  Patient delivered  via  spontaneous vaginal delivery. Her postpartum course was uncomplicated. She was discharged home in a stable good condition tolerating a regular diet and ambulating.  Pt was under good pain control management at time of delivery.    Discharge Date: 06/29/2013     Plan:       Follow-up appointment in 6 weeks.      Cristy Hilts, MD

## 2013-07-01 LAB — LAB USE ONLY - HISTORICAL SURGICAL PATHOLOGY

## 2014-07-25 NOTE — L&D Delivery Note (Signed)
Delivery Summary for [name],  No LMP recorded. Patient is pregnant., Estimated Date of Delivery: 06/24/15    Pt admitted at: 06/23/2015  6:10 PM    Patient Active Problem List   Diagnosis   . GERD (gastroesophageal reflux disease)   . History of preterm delivery, currently pregnant in second trimester   . [redacted] weeks gestation of pregnancy   . Small for gestational age       Time of Delivery: 2301  06/23/2015     Obstetrician:   Cristy Hilts, MD    Procedure: Spontaneous vaginal delivery    A female  infant was delivered from [facility] presentation.  CAN X 1    Infant Wt:            Apgars:  8  at and 9  at 5 minutes    ROM Time: 1935 06/23/15  Amniotic Fluid: Clear       Placenta and Cord:          Mechanism: Spontaneous       Description: Intact      Episiotomy: None     Lacerations: Yes    Laceration Type: Periurethral    Episiotomy/Laceration Repair: no     Estimated Blood Loss:   100 cc's        Specimens: none           Complications:  None    Anda Kraft Halsey Persaud

## 2014-11-26 LAB — RPR: RPR: NONREACTIVE

## 2014-11-26 LAB — HEPATITIS B SURFACE ANTIGEN W/ REFLEX TO CONFIRMATION: Hepatitis B Surface Antigen: NEGATIVE

## 2014-11-26 LAB — RUBELLA ANTIBODY, IGG: Rubella AB, IgG: IMMUNE

## 2014-11-26 LAB — HIV AG/AB 4TH GENERATION: HIV Ag/Ab, 4th Generation: NEGATIVE

## 2014-11-26 LAB — GONOCOCCUS CULTURE
Chlamydia trachomatis Culture: NEGATIVE
Culture Gonorrhoeae: NEGATIVE

## 2015-01-02 ENCOUNTER — Other Ambulatory Visit: Payer: Self-pay | Admitting: Obstetrics & Gynecology

## 2015-01-02 DIAGNOSIS — O09892 Supervision of other high risk pregnancies, second trimester: Secondary | ICD-10-CM

## 2015-01-23 ENCOUNTER — Ambulatory Visit
Admission: RE | Admit: 2015-01-23 | Discharge: 2015-01-23 | Disposition: A | Discharge status: Home / Self Care | Payer: No Typology Code available for payment source | Source: Ambulatory Visit | Attending: Obstetrics & Gynecology | Admitting: Obstetrics & Gynecology

## 2015-01-23 ENCOUNTER — Other Ambulatory Visit: Payer: Self-pay | Admitting: Obstetrics & Gynecology

## 2015-01-23 ENCOUNTER — Ambulatory Visit (HOSPITAL_BASED_OUTPATIENT_CLINIC_OR_DEPARTMENT_OTHER)
Admission: RE | Admit: 2015-01-23 | Discharge: 2015-01-23 | Disposition: A | Discharge status: Home / Self Care | Payer: No Typology Code available for payment source | Source: Ambulatory Visit | Attending: Obstetrics & Gynecology | Admitting: Obstetrics & Gynecology

## 2015-01-23 VITALS — BP 104/63 | HR 69

## 2015-01-23 DIAGNOSIS — O09892 Supervision of other high risk pregnancies, second trimester: Secondary | ICD-10-CM

## 2015-01-23 DIAGNOSIS — Z3A18 18 weeks gestation of pregnancy: Secondary | ICD-10-CM

## 2015-01-23 DIAGNOSIS — O09219 Supervision of pregnancy with history of pre-term labor, unspecified trimester: Secondary | ICD-10-CM

## 2015-01-23 DIAGNOSIS — O09212 Supervision of pregnancy with history of pre-term labor, second trimester: Secondary | ICD-10-CM | POA: Insufficient documentation

## 2015-01-23 HISTORY — DX: Personal history of pre-term labor: Z87.51

## 2015-01-23 HISTORY — DX: Supervision of other high risk pregnancies, second trimester: O09.892

## 2015-01-23 NOTE — Consults (Signed)
CONSULT NOTE    Date Time: 01/23/2015 2:11 PM  Patient Name: [name],[name]  Requesting Physician: Donald Siva, MD  [address]  [place], [place] [address]  Consulting Physician: Camelia Phenes, MD    Reason for Consultation: History of preterm delivery    History of Presenting Illness:     Thank you for requesting a Maternal Fetal Medicine consultation for your patient Ms. [name]  due to history of preterm PROM and delivery.  As you know she is a 29 y.o. year old G2P0101 now at [redacted]w[redacted]d weeks gestation (Estimated Date of Delivery: 06/24/15).  She has been taking vaginal Progesterone prophylaxis.    Blood pressure is  104/63 mm Hg.   Weight 134 lb (BMI 24.5)    Past Obstetric History:     Obstetric History    G2   P1   T0   P1   A0   TAB0   SAB0   E0   M0   L1       # Outcome Date GA Lbr Len/2nd Weight Sex Delivery Anes PTL Lv   2 Current            1 Preterm 06/27/13 [redacted]w[redacted]d  1.86 kg (4 lb 1.6 oz) M Vag-Spont Local Y Y      Name: [name], [name]      Apgar1:  9                Apgar5: 9        The child required ste-down NICU admission for 8 days; he is doing well.    Past Medical History:     Past Medical History   Diagnosis Date   . Physical exam, annual 2011   . Vocal cord nodules    . GERD (gastroesophageal reflux disease)    . Premature rupture of membranes, onset of labour after 24 hours, antepartum condition or complication, preterm, third trimester 06/24/2013   . History of preterm labor    . History of preterm delivery, currently pregnant in second trimester 01/23/2015     Past Surgical History:     Past Surgical History   Procedure Laterality Date   . Esophagogastroduodenoscopy  2004     [place],    . Tonsillectomy     . Adenoidectomy       Family History:     Family History   Problem Relation Age of Onset   . Diabetes Father    . Hypertension Father    . Alcohol abuse Neg Hx    . Arthritis Neg Hx    . Asthma Neg Hx    . Birth defects Neg Hx    . Cancer Neg Hx    . COPD Neg Hx    . Depression Neg Hx     . Drug abuse Neg Hx    . Early death Neg Hx    . Hearing loss Neg Hx    . Heart disease Neg Hx    . Hyperlipidemia Neg Hx    . Kidney disease Neg Hx    . Learning disabilities Neg Hx    . Mental retardation Neg Hx    . Miscarriages / Stillbirths Neg Hx    . Stroke Neg Hx    . Mental illness Neg Hx    . Vision loss Neg Hx    Negative for increased sources of genetic risk for mental retardation or major anomalies above the background risk of women her age.  Social History:     History     Social History   . Marital Status: Married     Spouse Name: N/A   . Number of Children: N/A   . Years of Education: N/A     Occupational History   . 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 Smoking status: Never Smoker    . Smokeless tobacco: Never Used   . Alcohol Use: No   . Drug Use: No   . Sexual Activity:     Partners: Male     Birth Control/ Protection: None     Other Topics Concern   . Not on file     Social History Narrative    Married No child        Does do exercise     Allergies:     Allergies   Allergen Reactions   . Bactrim [Sulfamethoxazole W/Trimethoprim (Co-Trimoxazole)] Hives     Medications:     Current Outpatient Prescriptions   Medication Sig Dispense Refill   . aspirin EC 81 MG EC tablet Take 81 mg by mouth nightly.     . Cholecalciferol (VITAMIN D) 2000 UNITS Cap Take by mouth nightly.     . Prenatal Vit-Fe Fumarate-FA (PRENATAL VITAMIN PO) Take 1 tablet by mouth daily.     . progesterone (ENDOMETRIN) 100 MG vaginal insert Place 100 mg vaginally nightly.       No current facility-administered medications for this encounter.     Review of Systems:   Specific to obstetric concerns, the patient reported no cramping/contractions,  leaking of fluid, vaginal bleeding, nausea or vomiting, backache or dysuria.    Ultrasound report:   Normal cervical length    Assessment/Plan:     I told the patient that her obstetric history places her at increased risk (about 40%) for recurrent PTD.  Most recurrences occur around the same gestational age as the previous PTD.  Because the best predictor of preterm delivery is cervical length, I have measured it today and it was normal. It will be repeated at 22 weeks. Shortening below the 10th centile at either scan would be an indication for cerclage placement   As for prevention of preterm delivery, Progesterone supplementation started around 18 weeks significantly reduces the risk of recurrence (e.g. Prometrium 100 mg per vagina QHS). She should continue the prophylaxis until 36 weeks.    Plan:  - TV scan at 22 weeks    Thank you again for your kind referral.  I spent 15 minutes of this 15-minute visit on face to face counseling of Ms. [name]  discussing the above listed issues and for coordination of her care.  If you have questions, please do not hesitate to call me.     Sincerely,    Signed by: Camelia Phenes, MD

## 2015-02-20 ENCOUNTER — Ambulatory Visit
Admission: RE | Admit: 2015-02-20 | Discharge: 2015-02-20 | Disposition: A | Discharge status: Home / Self Care | Payer: No Typology Code available for payment source | Source: Ambulatory Visit | Attending: Obstetrics & Gynecology | Admitting: Obstetrics & Gynecology

## 2015-02-20 DIAGNOSIS — Z3A22 22 weeks gestation of pregnancy: Secondary | ICD-10-CM | POA: Insufficient documentation

## 2015-02-20 DIAGNOSIS — O09892 Supervision of other high risk pregnancies, second trimester: Secondary | ICD-10-CM

## 2015-02-20 DIAGNOSIS — O09212 Supervision of pregnancy with history of pre-term labor, second trimester: Secondary | ICD-10-CM | POA: Insufficient documentation

## 2015-05-28 LAB — GROUP B STREP TRANSCRIBED: GBS Transcribed: NEGATIVE

## 2015-06-23 ENCOUNTER — Observation Stay: Payer: No Typology Code available for payment source | Admitting: Anesthesiology

## 2015-06-23 ENCOUNTER — Inpatient Hospital Stay
Admission: AD | Admit: 2015-06-23 | Discharge: 2015-06-25 | DRG: 775 | Disposition: A | Discharge status: Home / Self Care | Payer: No Typology Code available for payment source | Source: Ambulatory Visit | Attending: Obstetrics & Gynecology | Admitting: Obstetrics & Gynecology

## 2015-06-23 ENCOUNTER — Inpatient Hospital Stay: Payer: No Typology Code available for payment source | Admitting: Obstetrics

## 2015-06-23 DIAGNOSIS — Z3A39 39 weeks gestation of pregnancy: Secondary | ICD-10-CM

## 2015-06-23 DIAGNOSIS — O09213 Supervision of pregnancy with history of pre-term labor, third trimester: Secondary | ICD-10-CM

## 2015-06-23 DIAGNOSIS — O365930 Maternal care for other known or suspected poor fetal growth, third trimester, not applicable or unspecified: Principal | ICD-10-CM | POA: Diagnosis present

## 2015-06-23 LAB — CBC AND DIFFERENTIAL
Basophils Absolute Automated: 0.03 10*3/uL (ref 0.00–0.20)
Basophils Automated: 0 %
Eosinophils Absolute Automated: 0.15 10*3/uL (ref 0.00–0.70)
Eosinophils Automated: 1 %
Hematocrit: 33.7 % — ABNORMAL LOW (ref 37.0–47.0)
Hgb: 11.5 g/dL — ABNORMAL LOW (ref 12.0–16.0)
Immature Granulocytes Absolute: 0.07 10*3/uL — ABNORMAL HIGH
Immature Granulocytes: 0 %
Lymphocytes Absolute Automated: 3.21 10*3/uL (ref 0.50–4.40)
Lymphocytes Automated: 19 %
MCH: 28.8 pg (ref 28.0–32.0)
MCHC: 34.1 g/dL (ref 32.0–36.0)
MCV: 84.5 fL (ref 80.0–100.0)
MPV: 10.5 fL (ref 9.4–12.3)
Monocytes Absolute Automated: 0.8 10*3/uL (ref 0.00–1.20)
Monocytes: 5 %
Neutrophils Absolute: 12.44 10*3/uL — ABNORMAL HIGH (ref 1.80–8.10)
Neutrophils: 75 %
Nucleated RBC: 0 /100 WBC (ref 0–1)
Platelets: 312 10*3/uL (ref 140–400)
RBC: 3.99 10*6/uL — ABNORMAL LOW (ref 4.20–5.40)
RDW: 13 % (ref 12–15)
WBC: 16.63 10*3/uL — ABNORMAL HIGH (ref 3.50–10.80)

## 2015-06-23 LAB — TYPE AND SCREEN
AB Screen Gel: NEGATIVE
ABO Rh: A POS

## 2015-06-23 MED ORDER — FAMOTIDINE 10 MG/ML IV SOLN (WRAP)
20.0000 mg | Freq: Once | INTRAVENOUS | Status: DC | PRN
Start: 2015-06-23 — End: 2015-06-23

## 2015-06-23 MED ORDER — LIDOCAINE-EPINEPHRINE 1.5 %-1:200000 IJ SOLN
INTRAMUSCULAR | Status: DC
Start: 2015-06-23 — End: 2015-06-23
  Filled 2015-06-23: qty 30

## 2015-06-23 MED ORDER — ONDANSETRON HCL 4 MG/2ML IJ SOLN
4.0000 mg | Freq: Three times a day (TID) | INTRAMUSCULAR | Status: DC | PRN
Start: 2015-06-23 — End: 2015-06-23

## 2015-06-23 MED ORDER — OXYTOCIN-LACTATED RINGERS 30 UNIT/500ML IV SOLN
4.0000 m[IU]/min | INTRAVENOUS | Status: DC
Start: 2015-06-23 — End: 2015-06-23

## 2015-06-23 MED ORDER — ONDANSETRON 4 MG PO TBDP
8.0000 mg | ORAL_TABLET | Freq: Three times a day (TID) | ORAL | Status: DC | PRN
Start: 2015-06-23 — End: 2015-06-23

## 2015-06-23 MED ORDER — TERBUTALINE SULFATE 1 MG/ML IJ SOLN
0.2500 mg | Freq: Once | INTRAMUSCULAR | Status: DC | PRN
Start: 2015-06-23 — End: 2015-06-23

## 2015-06-23 MED ORDER — OXYTOCIN-LACTATED RINGERS 30 UNIT/500ML IV SOLN
7.5000 [IU]/h | INTRAVENOUS | Status: DC
Start: 2015-06-24 — End: 2015-06-24
  Administered 2015-06-23: 7.5 [IU]/h via INTRAVENOUS

## 2015-06-23 MED ORDER — OXYCODONE-ACETAMINOPHEN 5-325 MG PO TABS
1.0000 | ORAL_TABLET | Freq: Once | ORAL | Status: AC | PRN
Start: 2015-06-23 — End: 2015-06-24
  Administered 2015-06-24: 1 via ORAL
  Filled 2015-06-23: qty 1

## 2015-06-23 MED ORDER — FENTANYL 2 MCG/ML+BUPIVACAINE 0.125% 100 ML
EPIDURAL | Status: AC
Start: 2015-06-23 — End: 2015-06-23
  Administered 2015-06-23: 12 mL/h via EPIDURAL
  Filled 2015-06-23: qty 100

## 2015-06-23 MED ORDER — METOCLOPRAMIDE HCL 5 MG/ML IJ SOLN
10.0000 mg | Freq: Once | INTRAMUSCULAR | Status: DC | PRN
Start: 2015-06-23 — End: 2015-06-23

## 2015-06-23 MED ORDER — LACTATED RINGERS IV BOLUS
1000.0000 mL | Freq: Once | INTRAVENOUS | Status: DC
Start: 2015-06-23 — End: 2015-06-23

## 2015-06-23 MED ORDER — IBUPROFEN 600 MG PO TABS
600.0000 mg | ORAL_TABLET | Freq: Once | ORAL | Status: AC | PRN
Start: 2015-06-23 — End: 2015-06-24
  Administered 2015-06-24: 600 mg via ORAL
  Filled 2015-06-23: qty 1

## 2015-06-23 MED ORDER — CITRIC ACID-SODIUM CITRATE 334-500 MG/5ML PO SOLN
30.0000 mL | Freq: Once | ORAL | Status: DC | PRN
Start: 2015-06-23 — End: 2015-06-23

## 2015-06-23 MED ORDER — NALOXONE HCL 0.4 MG/ML IJ SOLN (WRAP)
0.1000 mg | INTRAMUSCULAR | Status: DC | PRN
Start: 2015-06-23 — End: 2015-06-23

## 2015-06-23 MED ORDER — FENTANYL 2 MCG/ML+BUPIVACAINE 0.125% 100 ML
EPIDURAL | Status: DC
Start: 2015-06-23 — End: 2015-06-23

## 2015-06-23 MED ORDER — EPHEDRINE SULFATE 50 MG/ML IJ SOLN
INTRAMUSCULAR | Status: DC
Start: 2015-06-23 — End: 2015-06-23
  Filled 2015-06-23: qty 1

## 2015-06-23 MED ORDER — LIDOCAINE-EPINEPHRINE 1.5 %-1:200000 IJ SOLN
INTRAMUSCULAR | Status: DC | PRN
Start: 2015-06-23 — End: 2015-06-23
  Administered 2015-06-23 (×2): 3 mL via EPIDURAL

## 2015-06-23 MED ORDER — LACTATED RINGERS IV SOLN
INTRAVENOUS | Status: DC
Start: 2015-06-23 — End: 2015-06-23

## 2015-06-23 MED ORDER — OXYTOCIN-LACTATED RINGERS 30 UNIT/500ML IV SOLN
INTRAVENOUS | Status: AC
Start: 2015-06-23 — End: 2015-06-23
  Administered 2015-06-23: 4 m[IU]/min via INTRAVENOUS
  Filled 2015-06-23: qty 1000

## 2015-06-23 NOTE — Anesthesia Preprocedure Evaluation (Signed)
Anesthesia Evaluation    AIRWAY    Mallampati: III    TM distance: >3 FB  Neck ROM: full  Mouth Opening:full   CARDIOVASCULAR    cardiovascular exam normal       DENTAL    no notable dental hx     PULMONARY    pulmonary exam normal     OTHER FINDINGS                      Anesthesia Plan    ASA 2     epidural                                 informed consent obtained      pertinent labs reviewed

## 2015-06-23 NOTE — Progress Notes (Addendum)
2055  Pt c/o pain 10/10 with contractions and requested epidural anesthesia.  Dr Tad Moore notified and bolus started.  Anesthesia at bedside at 1955; Risks and benefits explained; Pt agreed and verbalized understanding.  Consents signed and witnessed.  Time out at 2002.  Procedure completed without difficulty.  Pt denied any side effects.  PT straight catheterized without difficulty.  Pain level now 0/10.  Will continue with present management and anticipate NSVD.    2100  MD at bedside; tracing evaluated; vaginal exam 6cm; as per md, continue with present management; Pitocin at 4 mu's.  Pt comfortable with family at bedside offering support

## 2015-06-23 NOTE — Anesthesia Procedure Notes (Signed)
Epidural  Patient location during procedure: floor  Reason for block: Labor or C-section  Block at Surgeon's request: Yes    Start time: 06/23/2015 8:03 PM  End time: 06/23/2015 8:13 PM    Staffing  Anesthesiologist: Shelly Spenser ROBERT  Performed by: anesthesiologist     Pre-procedure Checklist   Completed: patient identified, timeout performed and correct site      Epidural  Patient monitoring: Pulse oximetry and NIBP    Premedication: No  Patient position: sitting    Skin Local: lidocaine 1%    Attempts  Number of attempts: 1                    Successful attempt  Interspace: L2-3      Needle type: Touhy needle   Needle gauge: 17  Injection technique: LOR saline    CSF Return: No   Blood Return: No  Paresthesia Pain: no    Needle Placement  Needle type: Touhy needle   Needle gauge: 17  Injection technique: LOR saline  CSF Return: No  Blood Return: No          Paresthesia Pain: no    Catheter Placement   Catheter type: side hole  Catheter size: 18 G  CSF Return: No  Blood Return: No  Test Dose:negative    Incremental injection: yes    No Catheter IV/SA Signs or Symptoms    Assessment   Sensory level: T6  Block Outcome: patient tolerated procedure well, no complications, successful block and pain improved  Additional Notes  Risks of bleeding, infection, nerve damage, back pain, one sided block, block not effective due to fast delivery, and headache lasting 1 -2 weeks.    Pauletta Browns  06/23/2015

## 2015-06-23 NOTE — H&P (Signed)
OB LABOR AND DELIVERY ADMISSION H&P    Date Time: 11/29/20167:49 PM  Room#A2012/A2012-01      No chief complaint on file.      Subjective:  [name] is a 29 y.o. G2 P44 female with a gestation age of [redacted]w[redacted]d and Estimated Date of Delivery: 06/24/15 who presents to the hospital for induction of labor.  Her current obstetrical history is significant for SGA.  Patient reports no complaints.       Objective:    Vital Signs:  Temp:  [98.4 F (36.9 C)-98.6 F (37 C)] 98.4 F (36.9 C)  Heart Rate:  [81-98] 98  Resp Rate:  [16-18] 18  BP: (111-113)/(64-73) 111/64 mmHg    Physical Exam:     General:   alert, appears stated age and cooperative   Skin:   normal   HEENT:  thyroid without masses   Lungs:   clear to auscultation bilaterally   Heart:   regular rate and rhythm, S1, S2 normal, no murmur, click, rub or gallop   Abdomen:  soft, non-tender; bowel sounds normal; no masses,  no organomegaly   FHT:  Baseline Rate: 135 BPM   Uterine Size: size equals dates   Presentations: cephalic   Cervix:       Labs:     Lab Results   Component Value Date    ABORH A POS 06/26/2013    HEPBSAG Negative 11/26/2014    GBS Negative 05/28/2015    RUBELLAABIGG Immune 11/26/2014       Assessment:  Patient is a 29 y.o., G2 P1 [redacted]w[redacted]d in Not in labor..    Problem List:  Principal Problem:    Small for gestational age  Active Problems:    [redacted] weeks gestation of pregnancy      Plan:  1. AROM clear  2. Pitocin induction    Risks, benefits, alternatives and possible complications have been discussed in detail with the patient.  Pre-admission, admission, and post admission procedures and expectations were discussed in detail.  All questions answered, all appropriate consents will be signed at the Hospital.         Significant Medical History:    Allergies   Allergen Reactions   . Bactrim [Sulfamethoxazole W/Trimethoprim (Co-Trimoxazole)] Hives       Prescriptions prior to admission   Medication Sig Dispense Refill Last Dose   . aspirin EC 81 MG EC  tablet Take 81 mg by mouth nightly.   Past Month at Unknown time   . Cholecalciferol (VITAMIN D) 2000 UNITS Cap Take by mouth nightly.   06/22/2015 at 2200   . Prenatal Vit-Fe Fumarate-FA (PRENATAL VITAMIN PO) Take 1 tablet by mouth daily.   06/22/2015 at 0800   . progesterone (ENDOMETRIN) 100 MG vaginal insert Place 100 mg vaginally nightly.   Past Month at Unknown time       OB History     Gravida Para Term Preterm AB TAB SAB Ectopic Multiple Living    2 1  1      1           Past Medical History   Diagnosis Date   . Physical exam, annual 2011   . Vocal cord nodules    . GERD (gastroesophageal reflux disease)    . Premature rupture of membranes, onset of labour after 24 hours, antepartum condition or complication, preterm, third trimester 06/24/2013   . History of preterm labor    . History of preterm delivery, currently pregnant in second  trimester 01/23/2015       Past Surgical History   Procedure Laterality Date   . Esophagogastroduodenoscopy  2004     [place],    . Tonsillectomy     . Adenoidectomy         Family History   Problem Relation Age of Onset   . Diabetes Father    . Hypertension Father    . Alcohol abuse Neg Hx    . Arthritis Neg Hx    . Asthma Neg Hx    . Birth defects Neg Hx    . Cancer Neg Hx    . COPD Neg Hx    . Depression Neg Hx    . Drug abuse Neg Hx    . Early death Neg Hx    . Hearing loss Neg Hx    . Heart disease Neg Hx    . Hyperlipidemia Neg Hx    . Kidney disease Neg Hx    . Learning disabilities Neg Hx    . Mental retardation Neg Hx    . Miscarriages / Stillbirths Neg Hx    . Stroke Neg Hx    . Mental illness Neg Hx    . Vision loss Neg Hx        Social History     Social History   . Marital Status: Married     Spouse Name: N/A   . Number of Children: N/A   . Years of Education: N/A     Occupational History   . 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 Smoking status: Never Smoker    . Smokeless tobacco: Never Used   . Alcohol Use: No   . Drug Use: No   .  Sexual Activity:     Partners: Male     Birth Control/ Protection: None     Other Topics Concern   . Not on file     Social History Narrative    Married No child        Does do exercise             Cristy Hilts, MD  06/23/2015  7:49 PM

## 2015-06-23 NOTE — Plan of Care (Signed)
Problem: Labor & Delivery  Goal: Maternal Status within defined parameters- labor & delivery  Outcome: Progressing

## 2015-06-24 LAB — GLUCOSE WHOLE BLOOD - POCT: Whole Blood Glucose POCT: 36 mg/dL — CL (ref 70–100)

## 2015-06-24 MED ORDER — MEASLES, MUMPS & RUBELLA VAC SC INJ
0.5000 mL | INJECTION | SUBCUTANEOUS | Status: DC | PRN
Start: 2015-06-24 — End: 2015-06-25

## 2015-06-24 MED ORDER — LANOLIN EX OINT
TOPICAL_OINTMENT | CUTANEOUS | Status: DC | PRN
Start: 2015-06-24 — End: 2015-06-25
  Filled 2015-06-24: qty 7

## 2015-06-24 MED ORDER — OXYCODONE-ACETAMINOPHEN 5-325 MG PO TABS
2.0000 | ORAL_TABLET | ORAL | Status: DC | PRN
Start: 2015-06-24 — End: 2015-06-25

## 2015-06-24 MED ORDER — OXYTOCIN-LACTATED RINGERS 30 UNIT/500ML IV SOLN
7.5000 [IU]/h | INTRAVENOUS | Status: DC
Start: 2015-06-24 — End: 2015-06-25

## 2015-06-24 MED ORDER — PRENATAL AD PO TABS
1.0000 | ORAL_TABLET | Freq: Every day | ORAL | Status: DC
Start: 2015-06-24 — End: 2015-06-25
  Administered 2015-06-24 – 2015-06-25 (×2): 1 via ORAL
  Filled 2015-06-24 (×2): qty 1

## 2015-06-24 MED ORDER — SIMETHICONE 80 MG PO CHEW
80.0000 mg | CHEWABLE_TABLET | Freq: Four times a day (QID) | ORAL | Status: DC | PRN
Start: 2015-06-24 — End: 2015-06-25

## 2015-06-24 MED ORDER — IBUPROFEN 600 MG PO TABS
600.0000 mg | ORAL_TABLET | Freq: Four times a day (QID) | ORAL | Status: DC
Start: 2015-06-24 — End: 2015-06-25
  Administered 2015-06-24 – 2015-06-25 (×6): 600 mg via ORAL
  Filled 2015-06-24 (×6): qty 1

## 2015-06-24 MED ORDER — MISOPROSTOL 200 MCG PO TABS
800.0000 ug | ORAL_TABLET | Freq: Once | ORAL | Status: DC | PRN
Start: 2015-06-24 — End: 2015-06-25

## 2015-06-24 MED ORDER — CALCIUM CARBONATE ANTACID 500 MG PO CHEW
1000.0000 mg | CHEWABLE_TABLET | Freq: Three times a day (TID) | ORAL | Status: DC | PRN
Start: 2015-06-24 — End: 2015-06-25

## 2015-06-24 MED ORDER — WITCH HAZEL-GLYCERIN EX PADS
1.0000 | MEDICATED_PAD | CUTANEOUS | Status: DC | PRN
Start: 2015-06-24 — End: 2015-06-25
  Administered 2015-06-24: 1 via TOPICAL
  Filled 2015-06-24: qty 40

## 2015-06-24 MED ORDER — HYDROCORTISONE 1 % EX OINT
TOPICAL_OINTMENT | Freq: Three times a day (TID) | CUTANEOUS | Status: DC | PRN
Start: 2015-06-24 — End: 2015-06-25

## 2015-06-24 MED ORDER — BENZOCAINE-MENTHOL 20-0.5 % EX AERO
1.0000 | INHALATION_SPRAY | CUTANEOUS | Status: DC | PRN
Start: 2015-06-24 — End: 2015-06-25
  Administered 2015-06-24: 1 via TOPICAL
  Filled 2015-06-24: qty 56

## 2015-06-24 MED ORDER — METHYLERGONOVINE MALEATE 0.2 MG/ML IJ SOLN
200.0000 ug | INTRAMUSCULAR | Status: DC | PRN
Start: 2015-06-24 — End: 2015-06-25

## 2015-06-24 MED ORDER — ACETAMINOPHEN 325 MG PO TABS
650.0000 mg | ORAL_TABLET | ORAL | Status: DC | PRN
Start: 2015-06-24 — End: 2015-06-25
  Administered 2015-06-24 (×2): 650 mg via ORAL
  Filled 2015-06-24 (×3): qty 2

## 2015-06-24 MED ORDER — METHYLERGONOVINE MALEATE 0.2 MG/ML IJ SOLN
200.0000 ug | Freq: Once | INTRAMUSCULAR | Status: DC | PRN
Start: 2015-06-24 — End: 2015-06-25

## 2015-06-24 MED ORDER — TETANUS-DIPHTH-ACELL PERTUSSIS 5-2.5-18.5 LF-MCG/0.5 IM SUSP
0.5000 mL | INTRAMUSCULAR | Status: DC | PRN
Start: 2015-06-24 — End: 2015-06-25

## 2015-06-24 MED ORDER — ACETAMINOPHEN 650 MG RE SUPP
650.0000 mg | RECTAL | Status: DC | PRN
Start: 2015-06-24 — End: 2015-06-25

## 2015-06-24 MED ORDER — SENNOSIDES-DOCUSATE SODIUM 8.6-50 MG PO TABS
1.0000 | ORAL_TABLET | Freq: Every evening | ORAL | Status: DC | PRN
Start: 2015-06-24 — End: 2015-06-25
  Filled 2015-06-24: qty 1

## 2015-06-24 MED ORDER — ALUM & MAG HYDROXIDE-SIMETH 200-200-20 MG/5ML PO SUSP
30.0000 mL | Freq: Four times a day (QID) | ORAL | Status: DC | PRN
Start: 2015-06-24 — End: 2015-06-25

## 2015-06-24 NOTE — Anesthesia Postprocedure Evaluation (Signed)
Anesthesia Post Evaluation    Patient: [name]    Procedures performed: * No procedures listed *    Anesthesia type: Epidural    Patient location:Postpartum unit    Last vitals:   Filed Vitals:    06/24/15 0911   BP: 107/60   Pulse: 86   Temp: 36.6 C (97.9 F)   Resp: 18   SpO2: 98       Post pain: Patient not complaining of pain, continue current therapy      Mental Status:awake and alert     Respiratory Function: tolerating room air    Cardiovascular: stable    Nausea/Vomiting: patient not complaining of nausea or vomiting    Hydration Status: adequate    Post assessment: no apparent anesthetic complications and no reportable events

## 2015-06-24 NOTE — Lactation Note (Signed)
Pt visited at bedside to offer lactation support, 1 to 1 assistance given with breast pumping, instructions reviewed on care of pump parts, pumping every 2-3 hours for 20 minutes,  discussed hand compression & hand expression,  d/c resources reviewed, breast pump rental reviewed, pt. will rent breast pump at discharge, call prn for further assistance. Jolyn Nap RN,IBCLC

## 2015-06-24 NOTE — Plan of Care (Signed)
Pt stable at this time, no complaints of headaches, visual disturbances or dizziness. Encouraged to increase oral fluid intake, hand expression and breastfeeding; instructed to call if needing assistance. Reinforced the need to call for assist x1 more once with the urge to void, pt verbalized understanding. Will continue to monitor.

## 2015-06-24 NOTE — Plan of Care (Signed)
Problem: Safety  Goal: Patient will be free from injury during hospitalization  Intervention: Provide and maintain safe environment  Mom assessment wnl. VS stable. No complaints of pain at this time. POC safety discussed with mom. Mom verbalized understanding. Mom pumping and visiting baby in NICU w/ FOB. Syringes/labels for breast milk  at bedside Will continue to monitor.

## 2015-06-24 NOTE — Progress Notes (Signed)
Postpartum Note    Subjective:  Delivery Type: Vaginal   No complaints.  Minimal Bleeding.  Breastfeeding well.    Objective:  Vital Signs:  Temp:  [97.5 F (36.4 C)-98.6 F (37 C)] 97.5 F (36.4 C)  Heart Rate:  [72-98] 72  Resp Rate:  [16-18] 18  BP: (101-124)/(45-73) 111/51 mmHg  Breast: soft, non tender   Fundus: firm, non tender  Lochia: mod, rubra.    Recent Labs:    Recent Labs  Lab 06/23/15  1920   WBC 16.63*   HGB 11.5*   HEMATOCRIT 33.7*   PLATELETS 312       Assessment/Plan:  Stable - Postpartum Day 1  Routine care, discharge  tomorrow, F/U in 6 weeks.  Prescriptions given: PNV, Motrin.    [name]

## 2015-06-24 NOTE — Lactation Note (Cosign Needed)
Pt attended breastfeeding class, observed infant at mother's breast, infant sleeping, just had circumcision, discussed supply & demand of breast milk, hand expression, engorgement, feeding cues, breast feeding positions benefits of breastfeeding for mother & infant, d/c resources reviewed, call prn for further assistance. Jolyn Nap RN,IBCLC

## 2015-06-24 NOTE — Lactation Note (Signed)
Pt visited at bedside to offer lactation support, when entering pt's  room infant crying, pt. Attempting to breast feed infant in football hold, 1 to 1 assistance given with positioning & latch, infant skin to skin, observed infant grunting and nasal flaring noted, color pale, RN called stat to Pt's room, infant transported stat to nursery via crib At by [facility]. @ 1245, call prn for further assistance. Jolyn Nap RN,IBCLC

## 2015-06-24 NOTE — Progress Notes - NICU (Signed)
Blood sugar results 36 belong to baby not mom. Did give baby d10 bolus x1. Baby blood sugar improved. See baby flow sheet charted.

## 2015-06-24 NOTE — Plan of Care (Signed)
Problem: Pain  Goal: Patient's pain/discomfort is manageable  Outcome: Progressing  Pain is being controlled by ibuprofen. At this moment patient has no pain. Will continue to monitor

## 2015-06-25 NOTE — Discharge Summary (Signed)
Obstetrical Discharge Form    Primary OB Clinician:  Tobin Chad of  [facility]    Gestational Age: [redacted]w[redacted]d, Estimated Date of Delivery: 06/24/15    Antepartum complications: none  Admission Diagnosis: Small for gestational age    Date of Delivery: 06/23/2015    Time of Delivery: 11:01 PM      Delivered By: Cristy Hilts     Delivery Type: induced vaginal    Tubal Ligation: n/a    Baby: Liveborn Female ; Birth weight: 6 lb 7.7 oz (2940 g) ; Apgar 1 minute: 8  Apgar 5 minute: 9 ;                                                       Feeding Type: Formula     Anesthesia: Epidural anesthesia    Delivery complications: none    Episiotomy: None   Laceration Type:  Periurethral     Placenta: 06/23/2015 , 11:04 PM         Spontaneous , Intact         Cord:  3 vessels     Hospital Course : [name] is a 29 y.o. with Small for gestational age. She was admitted to the hospital for Small for gestational age. Her antepartum course was uncomplicated.  Patient delivered  via  induced vaginal delivery. Her postpartum course was uncomplicated. She was discharged home in a stable good condition tolerating a regular diet and ambulating.  Pt was under good pain control management at time of delivery.    Discharge Date: 06/25/2015     Plan:       Follow-up appointment in 6 weeks.      Waldon Merl, MD  .

## 2015-06-25 NOTE — Progress Notes (Signed)
PP DAY #2        Date Time: 06/25/2015 7:47 AM  Patient Name:   [name]      Date of Operation:   * No surgery found *      Baby: Liveborn @BABYSEXEBC @     Subjective:     Pt without complaint. Pain well controlled with PO pain meds. Tolerating regular diet, ambulating and voiding without difficulty. Minimal lochia rubra. Breastfeeding.      Objective:   BP 110/64 mmHg  Pulse 80  Temp(Src) 96.9 F (36.1 C) (Oral)  Resp 18  Ht 1.549 m (5\' 1" )  Wt 77.565 kg (171 lb)  BMI 32.33 kg/m2  SpO2 96%  Breastfeeding? Yes      Gen: NAD  Abd: S, NT, ND     FF/NT @U     Ext: trace edema, no Homan's      Recent Labs  Lab 06/23/15  1920   WBC 16.63*   HGB 11.5*   HEMATOCRIT 33.7*   PLATELETS 312       Assessment/Plan:      A: PP#2     P: ambulate  Lactation consult  Iron supplement  Routine postop/postpartum care  D/C patient to home.                                                                                 Waldon Merl, MD  06/25/2015  7:47 AM

## 2015-06-25 NOTE — Plan of Care (Signed)
Pt with adequate pain management while taking motrin 600 mg every 6 hrs. Pt breast pumps and ambulates in hallway well, going to visit her baby in the NICU. Discharge instructions provided to pt, she verbalized understanding.  Pt left unit walking, escorted by a staff member, husband walking alongside.

## 2015-06-25 NOTE — Progress Notes (Signed)
Writer met with MOB and FOB at bedside, baby currently in the NICU.  This is MOB and FOB's second child, first child was born at 18w and was in the NICU for 10 days - first son is now 31 years old.  MOB plans on breast feeding baby and is currently pumping.  MOB received prenatal care at [facility] and pediatrician is CMA.  MOB denies any hx of mental illness, substance use, trauma/abuse or CPS involvement - MOB is aware of PPD, has not experienced PPD in the past.  MOB has a great support system including FOB, parents, sister, and FOB's parents.  MOB and FOB have all supplies at home for baby.  Baby's insurance is [employer], Clinical research associate encouraged parents to add baby ASAP and request process to be expedited - they verbalized understanding and stated have already contact insurance company.  SW to continue to follow as needs arise.       06/25/15 1112   Healthcare Decisions   Interviewed: Parent   Interviewee Contact Information: MOB: [name] [phone]   Additional Emergency Contacts? FOB: [name] [phone]   Social History/Developmental (NICU/PEDS)   Developmental milestones met? Yes   Prenatal care since Regular since 1st trimester   Prenatal care provider: [facility]   Postpartum Depression Awareness Yes   Prior to admission   Lives with: Parents;Siblings (# and ages in comment)   Is the father of the baby involved/supportive? Yes, FOB involved   Parental History (MOB denies hx of mental illness, substance use, trauma/abuse)   Mother's Employment Status Full time   Father's Employment Status Full time   Eligible for SSI/MA? No   Infant's Insurance [employer] letter of support? No   Type of Residence Private residence   Home Layout Two level   Living Arrangements Spouse/significant other;Children   Community Resources (PEDS) None   Child Protective Services (CPS) involved? No   Discharge Planning (Peds)   Support Systems Spouse/significant other;Parent;Family  members   Anticipated [place] plan discussed with: Same as interviewed   [place] discussion contact information: MOB: [name] [phone]   Mode of transportation: Private car (family member)   Follow up appointment scheduled? (Not at this time, MOB aware to schedule appt w/in 2 days of [place])   Follow up appointment scheduled with: (Pediatrician: CMA)   Potential barriers to discharge: (No [place] barriers anticipated at this time)   Needs Supplies: No needs   Adoption   Is there an adoption planned? No   Authorization for D/C to person other than parent filed? (N/A)     Chinita Greenland, MSW  x: [phone]

## 2015-06-25 NOTE — Discharge Instructions (Signed)
Barry S. Rothman, M.D., F.A.C.O.G.                                                                       [address], [address]    [name] M. Honor Fairbank, M.D., F.A.C.O.G.                                                                         [place], [place]  [address]  Marc E. Siegel, M.D., F.A.C.O.G.                                                                                                 Phone [phone]  Sonia J. Salgado, M.D., F.A.C.O.G.                                                                                                  Fax [phone]  Kristina K. Hibshman, M.D., F.A.C.O.G.                                                                                                                                                  OBSTETRICS * GYNECOLOGY * INFERTILITY                                                                                                                                       POSTPARTUM INSTRUCTIONS    Congratulations on the birth of you new baby!  We hope the following information will help you during the first few weeks after you return home.  Please call our office soon after you are settled to make an appointment for approximately 6 weeks from the date of your delivery.    POST OP CARE  If you had a cesarean section, and staples were used to close your incision, then you need to call our office ASAP and make an appointment to be seen in 5-7 days. A staple remover and steri-strips will be given to you by the nurse on the day of discharge. Bring them to the office for your post op visit to have your staples removed. If you have steri-strips on your incision and staples were not used, then you can take them off in 5 days in the shower.     POST PARTUM APPOINTMENT  Please call our office within 24-48 hours after delivery to schedule your postpartum appointment. Waiting to schedule this appointment may result in us not being able to accommodate your availability and  the appointment would then be a well woman visit as the time period for a postpartum appointment will have passed.       ACTIVITY  Slowly increase your activity level when you arrive home.  If you must climb stairs, go slowly and be careful.  Do not lift anything heavier than your baby for the first week, or if you had a C-section, for four weeks.  You may rest or take short walks outside.    Driving should be prohibited for at least one week, and three weeks if you had a C-section.  Make sure you get as much rest as possible with frequent naps during the day    Showers are preferable to tub baths.  If your episiotomy stitches are painful, sitting in shallow (four inches) water such as a sitz bath may help reduce pain and swelling.  Make sure your postpartum nurse gives you instructions on episiotomy stitches care.  If you had a C-section, gently clean your incision each time you shower and pat dry.  Do not use soap on your incision.    You may begin postpartum exercises when you get home.  If you had a C-section, wait three weeks before starting the program.    BREAST CARE    A well-fitting and supportive bra should be worn day and night until the breasts return to normal size.  This will help to prevent stretching of ligaments.      If you are breast-feeding, drink as much fluid, especially water, as possible.  Avoid excessive caffeine and alcohol, and eat a well balanced diet.  Your uterus will contract while breast-feeding, and you may have heavier bleeding during feeding.  Advil or Motrin may be used for the pain relief.  Air dry your nipples after feeding.  Breast pads may be used in your bra to absorb any leaking.  If you notice breast redness, soreness and/or   have a temperature over 101F- CALL US IMMEDIATELY - you may have an infection.  If you are not breast-feeding, you should wear a bra at all times.  If your breasts become engorged with milk, wear a tight bra and apply ice packs.  Take Tylenol or  Motrin for the discomfort.    VAGINAL PERINEAL CARE  Change pads often and with each voiding.  The bleeding will gradually turn dark brown, then yellow and   may last up to six weeks.  DO NOT USE TAMPONS!  After urinating or moving your bowels, cleanse your episiotomy area with water, and gently dry, wiping from front to back.    If you are not breast-feeding, you may have a menstrual period as early as four weeks after delivery, but often it will take six to eight weeks for menses to resume.  If you are breast-feeding, it may be several months before you will resume menstruation, or not at all while breast-feeding.    DO NOT HAVE SEXUAL INTERCOURSE until after your postpartum visit.  Please review literature on various methods of contraception so you may be prepared to discuss your options at your postpartum visit.    MEDICATIONS  Medications allowed for postpartum (breast-feeding included):   Plain Tylenol or Extra-strength Tylenol for headaches.   Mylanta for acid indigestion   Anusol or Preparation H for hemorrhoids   Prenatal Vitamins to be continued for three months after delivery; for     breast-feeding mothers as long as you are nursing.   Peri-Colace, Colace, Senokot, Metamucil for constipation. Any other    medications must be approved by your pediatrician if you are breast    feeding.   Advil or Motrin for uterine contractions   Iron - if you were on iron supplements prior to delivery, continue taking it for    3 months.    CIRCUMCISION CHECK  If a circumcision was done on your baby boy, the pediatrician can check the baby at his one week appointment to check the healing process.    CALL THE OFFICE IF YOU HAVE:   CHILLS OR FEVER ABOVE 101F   HEAVY BLEEDING (changing pads every thirty minutes for two hours)   PAIN WITH URINATION   SEVERE PAIN OR REDNESS OF THE BREASTS   REDNESS, SWELLING AND/OR PUS FROM THE C-SECTION INCISION   DIZZINESS OR FAINTING,   SEVERE ABDOMINAL PAIN OR VOMITTING

## 2015-06-26 ENCOUNTER — Emergency Department: Payer: No Typology Code available for payment source

## 2015-06-26 ENCOUNTER — Emergency Department
Admission: EM | Admit: 2015-06-26 | Discharge: 2015-06-27 | Disposition: A | Discharge status: Home / Self Care | Payer: No Typology Code available for payment source | Attending: Emergency Medicine | Admitting: Emergency Medicine

## 2015-06-26 DIAGNOSIS — O864 Pyrexia of unknown origin following delivery: Secondary | ICD-10-CM | POA: Insufficient documentation

## 2015-06-26 DIAGNOSIS — O8622 Infection of bladder following delivery: Secondary | ICD-10-CM | POA: Insufficient documentation

## 2015-06-26 DIAGNOSIS — N3000 Acute cystitis without hematuria: Secondary | ICD-10-CM

## 2015-06-26 LAB — URINALYSIS, REFLEX TO MICROSCOPIC EXAM IF INDICATED
Bilirubin, UA: NEGATIVE
Glucose, UA: NEGATIVE
Ketones UA: NEGATIVE
Nitrite, UA: NEGATIVE
Protein, UR: 30 — AB
Specific Gravity UA: 1.006 (ref 1.001–1.035)
Urine pH: 8 (ref 5.0–8.0)
Urobilinogen, UA: NEGATIVE mg/dL

## 2015-06-26 LAB — COMPREHENSIVE METABOLIC PANEL
ALT: 18 U/L (ref 0–55)
AST (SGOT): 22 U/L (ref 5–34)
Albumin/Globulin Ratio: 0.7 — ABNORMAL LOW (ref 0.9–2.2)
Albumin: 2.4 g/dL — ABNORMAL LOW (ref 3.5–5.0)
Alkaline Phosphatase: 127 U/L — ABNORMAL HIGH (ref 37–106)
Anion Gap: 9 (ref 5.0–15.0)
BUN: 13 mg/dL (ref 7.0–19.0)
Bilirubin, Total: 0.2 mg/dL (ref 0.2–1.2)
CO2: 21 mEq/L — ABNORMAL LOW (ref 22–29)
Calcium: 8.5 mg/dL (ref 8.5–10.5)
Chloride: 111 mEq/L (ref 100–111)
Creatinine: 0.9 mg/dL (ref 0.6–1.0)
Globulin: 3.3 g/dL (ref 2.0–3.6)
Glucose: 96 mg/dL (ref 70–100)
Potassium: 4 mEq/L (ref 3.5–5.1)
Protein, Total: 5.7 g/dL — ABNORMAL LOW (ref 6.0–8.3)
Sodium: 141 mEq/L (ref 136–145)

## 2015-06-26 LAB — LIPASE: Lipase: 31 U/L (ref 8–78)

## 2015-06-26 LAB — CBC AND DIFFERENTIAL
Basophils Absolute Automated: 0.02 10*3/uL (ref 0.00–0.20)
Basophils Automated: 0 %
Eosinophils Absolute Automated: 0.24 10*3/uL (ref 0.00–0.70)
Eosinophils Automated: 1 %
Hematocrit: 31.4 % — ABNORMAL LOW (ref 37.0–47.0)
Hgb: 10.6 g/dL — ABNORMAL LOW (ref 12.0–16.0)
Immature Granulocytes Absolute: 0.13 10*3/uL — ABNORMAL HIGH
Immature Granulocytes: 1 %
Lymphocytes Absolute Automated: 2.48 10*3/uL (ref 0.50–4.40)
Lymphocytes Automated: 14 %
MCH: 28.9 pg (ref 28.0–32.0)
MCHC: 33.8 g/dL (ref 32.0–36.0)
MCV: 85.6 fL (ref 80.0–100.0)
MPV: 9.9 fL (ref 9.4–12.3)
Monocytes Absolute Automated: 1.26 10*3/uL — ABNORMAL HIGH (ref 0.00–1.20)
Monocytes: 7 %
Neutrophils Absolute: 14.27 10*3/uL — ABNORMAL HIGH (ref 1.80–8.10)
Neutrophils: 78 %
Nucleated RBC: 0 /100 WBC (ref 0–1)
Platelets: 338 10*3/uL (ref 140–400)
RBC: 3.67 10*6/uL — ABNORMAL LOW (ref 4.20–5.40)
RDW: 14 % (ref 12–15)
WBC: 18.27 10*3/uL — ABNORMAL HIGH (ref 3.50–10.80)

## 2015-06-26 LAB — HEMOLYSIS INDEX: Hemolysis Index: 2 (ref 0–18)

## 2015-06-26 LAB — LACTIC ACID, PLASMA: Lactic Acid: 1.1 mmol/L (ref 0.2–2.0)

## 2015-06-26 LAB — GFR: EGFR: >60

## 2015-06-26 MED ORDER — IBUPROFEN 600 MG PO TABS
600.0000 mg | ORAL_TABLET | Freq: Once | ORAL | Status: AC
Start: 2015-06-26 — End: 2015-06-26
  Administered 2015-06-26: 600 mg via ORAL
  Filled 2015-06-26: qty 1

## 2015-06-26 MED ORDER — SODIUM CHLORIDE 0.9 % IV MBP
1.0000 g | Freq: Once | INTRAVENOUS | Status: AC
Start: 2015-06-26 — End: 2015-06-27
  Administered 2015-06-26: 1 g via INTRAVENOUS
  Filled 2015-06-26: qty 100
  Filled 2015-06-26: qty 1000

## 2015-06-26 MED ORDER — ACETAMINOPHEN 500 MG PO TABS
1000.0000 mg | ORAL_TABLET | Freq: Once | ORAL | Status: AC
Start: 2015-06-26 — End: 2015-06-26
  Administered 2015-06-26: 1000 mg via ORAL
  Filled 2015-06-26: qty 2

## 2015-06-26 MED ORDER — SODIUM CHLORIDE 0.9 % IV BOLUS
1000.0000 mL | Freq: Once | INTRAVENOUS | Status: AC
Start: 2015-06-26 — End: 2015-06-26
  Administered 2015-06-26: 1000 mL via INTRAVENOUS

## 2015-06-26 NOTE — ED Provider Notes (Signed)
EMERGENCY DEPARTMENT HISTORY AND PHYSICAL EXAM     Physician/Midlevel provider first contact with patient: 06/26/15 2157         Date: 06/26/2015  Patient Name: [name]    History of Presenting Illness     Chief Complaint   Patient presents with   . Fever     History Provided By: Patient    Chief Complaint: Fever  Onset: last night   Timing: Gradual  Location: Constitutional   Quality: Objective  Severity: 101.771F  Exacerbating factors: None  Alleviating factors: None  Associated Symptoms: Diaphoresis, chills, HA,   Pertinent Negatives: dysuria, abd pain, vaginal discharge, coughing, sore throat, rhinorrhea, rash, ear pain, breast erythema, breast drainage.    Additional History: [name] is a 29 y.o. female who had a vaginal delivery 3 days ago with no complications is presenting to the ED with a fever that was as high as 101.771F since last night. Associated symptoms include diaphoresis, chills, and headache. She denies dysuria, abd pain, vaginal discharge, coughing, sore throat, rhinorrhea, rash, ear pain, breast erythema, breast drainage, or any other symptoms. Pt states that her newborn in in the NICU due to pna. Pt is GBS negative. Pt notes she has vaginal bleeding that is at baseline from the pregnancy.    PCP: Nelle Don, MD  SPECIALISTS:    No current facility-administered medications for this encounter.     Current Outpatient Prescriptions   Medication Sig Dispense Refill   . ibuprofen (ADVIL,MOTRIN) 200 MG tablet Take 200 mg by mouth every 6 (six) hours as needed for Pain.     . cephALEXin (KEFLEX) 500 MG capsule Take 1 capsule (500 mg total) by mouth 4 (four) times daily. 28 capsule 0   . Cholecalciferol (VITAMIN D) 2000 UNITS Cap Take by mouth nightly.     . Prenatal Vit-Fe Fumarate-FA (PRENATAL VITAMIN PO) Take 1 tablet by mouth daily.         Past History     Past Medical History:  Past Medical History   Diagnosis Date   . Physical exam, annual 2011   . Vocal cord nodules    . GERD  (gastroesophageal reflux disease)    . Premature rupture of membranes, onset of labour after 24 hours, antepartum condition or complication, preterm, third trimester 06/24/2013   . History of preterm labor    . History of preterm delivery, currently pregnant in second trimester 01/23/2015       Past Surgical History:  Past Surgical History   Procedure Laterality Date   . Esophagogastroduodenoscopy  2004     [place],    . Tonsillectomy     . Adenoidectomy         Family History:  Family History   Problem Relation Age of Onset   . Diabetes Father    . Hypertension Father    . Alcohol abuse Neg Hx    . Arthritis Neg Hx    . Asthma Neg Hx    . Birth defects Neg Hx    . Cancer Neg Hx    . COPD Neg Hx    . Depression Neg Hx    . Drug abuse Neg Hx    . Early death Neg Hx    . Hearing loss Neg Hx    . Heart disease Neg Hx    . Hyperlipidemia Neg Hx    . Kidney disease Neg Hx    . Learning disabilities Neg Hx    .  Mental retardation Neg Hx    . Miscarriages / Stillbirths Neg Hx    . Stroke Neg Hx    . Mental illness Neg Hx    . Vision loss Neg Hx        Social History:  Social History   Substance Use Topics   . Smoking status: Never Smoker    . Smokeless tobacco: Never Used   . Alcohol Use: 1.0 oz/week     2 Standard drinks or equivalent per week       Allergies:  Allergies   Allergen Reactions   . Bactrim [Sulfamethoxazole W/Trimethoprim (Co-Trimoxazole)] Hives       Review of Systems     Constitutional: Positive for fever, chills and diaphoresis  Neurological: Negative for speech changes, weakness, or numbness.  Eyes: Negative for visual changes or eye pain.  HENT: Positive for headache. Negative for sore throat, neck pain, or runny nose.   Cardiovascular: Negative for chest pain.   Respiratory: Negative for shortness of breath.   Gastrointestinal: Negative for abdominal pain, nausea, vomiting, diarrhea, or blood in stool.   Genitourinary: Negative for dysuria, hematuria, breast erythema, breast drainage, vaginal discharge.  +vaginal bleeding (at baseline from pregnancy),   Musculoskeletal: Negative for gait changes, joint pain or muscle pain.   Skin: Negative for itching or rash.   Hematological: Negative for easy bruising.    Physical Exam   BP 119/58 mmHg  Pulse 72  Temp(Src) 99.6 F (37.6 C)  Resp 18  SpO2 98%      Physical Exam   Constitutional: Oriented to person, place, and time and well-developed, well-nourished, and in no distress. Non toxic appearing. Warm to touch.  Head: Normocephalic and atraumatic.   Mouth/Throat: Oropharynx is clear and moist.   Eyes: Conjunctivae normal and EOM are normal. Pupils are equal, round, and reactive to light.    Neck: Normal range of motion. Neck supple. No thyromegaly present.   Cardiovascular: Normal rate, regular rhythm, normal heart sounds and intact distal pulses.  No murmur heard.  Pulmonary/Chest: Effort normal and breath sounds normal.   Abdominal: Soft. Non distended. Enlarged uterus. Non tender to palpation. No rebound or guarding  Musculoskeletal: No peripheral edema. No calf swelling or tenderness.    Neurological: Patient is alert and oriented to person, place, and time. No cranial nerve deficit. Gait normal. GCS score is 15.   Skin: Skin is warm and dry. No rash  Psychiatric: Affect normal.         Diagnostic Study Results     Labs -     Results     Procedure Component Value Units Date/Time    Rapid influenza A/B antigens [[id number]] Collected:  06/26/15 2201    Specimen Information:  Nasopharyngeal from Nasal Aspirate Updated:  06/26/15 2242    Narrative:      ORDER#: [id number]                                    ORDERED BY: Carles Collet  SOURCE: Nasal Aspirate                               COLLECTED:  06/26/15 22:01  ANTIBIOTICS AT COLL.:  RECEIVED :  06/26/15 22:21  Influenza Rapid Antigen A&B                FINAL       06/26/15 22:42  06/26/15   Negative for Influenza A and B             Reference Range: Negative      Comprehensive Metabolic  Panel (CMP) [[id number]]  (Abnormal) Collected:  06/26/15 2201    Specimen Information:  Blood Updated:  06/26/15 2240     Glucose 96 mg/dL      BUN 40.9 mg/dL      Creatinine 0.9 mg/dL      Sodium 811 mEq/L      Potassium 4.0 mEq/L      Chloride 111 mEq/L      CO2 21 (L) mEq/L      Calcium 8.5 mg/dL      Protein, Total 5.7 (L) g/dL      Albumin 2.4 (L) g/dL      AST (SGOT) 22 U/L      ALT 18 U/L      Alkaline Phosphatase 127 (H) U/L      Bilirubin, Total 0.2 mg/dL      Globulin 3.3 g/dL      Albumin/Globulin Ratio 0.7 (L)      Anion Gap 9.0     Lipase [[id number]] Collected:  06/26/15 2201    Specimen Information:  Blood Updated:  06/26/15 2240     Lipase 31 U/L     Hemolysis index [[id number]] Collected:  06/26/15 2201     Hemolysis Index 2 Updated:  06/26/15 2240    GFR [[id number]] Collected:  06/26/15 2201     EGFR >60.0 Updated:  06/26/15 2240    UA, Reflex to Microscopic (pts 3 + yrs) [[id number]]  (Abnormal) Collected:  06/26/15 2202    Specimen Information:  Urine Updated:  06/26/15 2235     Urine Type Clean Catch      Color, UA Yellow      Clarity, UA Hazy      Specific Gravity UA 1.006      Urine pH 8.0      Leukocyte Esterase, UA Large (A)      Nitrite, UA Negative      Protein, UR 30 (A)      Glucose, UA Negative      Ketones UA Negative      Urobilinogen, UA Negative mg/dL      Bilirubin, UA Negative      Blood, UA Large (A)      RBC, UA TNTC (A) /hpf      WBC, UA TNTC (A) /hpf      Squamous Epithelial Cells, Urine 0 - 5 /hpf     CBC and differential [[id number]]  (Abnormal) Collected:  06/26/15 2201    Specimen Information:  Blood from Blood Updated:  06/26/15 2231     WBC 18.27 (H) x10 3/uL      Hgb 10.6 (L) g/dL      Hematocrit 03.4 (L) %      Platelets 338 x10 3/uL      RBC 3.67 (L) x10 6/uL      MCV 85.6 fL      MCH 28.9 pg      MCHC 33.8 g/dL      RDW 14 %      MPV 9.9 fL      Neutrophils 78 %      Lymphocytes Automated  14 %      Monocytes 7 %      Eosinophils Automated 1 %      Basophils Automated 0 %       Immature Granulocyte 1 %      Nucleated RBC 0 /100 WBC      Neutrophils Absolute 14.27 (H) x10 3/uL      Abs Lymph Automated 2.48 x10 3/uL      Abs Mono Automated 1.26 (H) x10 3/uL      Abs Eos Automated 0.24 x10 3/uL      Absolute Baso Automated 0.02 x10 3/uL      Absolute Immature Granulocyte 0.13 (H) x10 3/uL     Lactic acid x2, sepsis [[id number]] Collected:  06/26/15 2200    Specimen Information:  Blood Updated:  06/26/15 2212     Lactic acid 1.1 mmol/L     Narrative:      Cancel if the initial lactate level is < 2.0 mmol/L.          Radiologic Studies -   Radiology Results (24 Hour)     Procedure Component Value Units Date/Time    US Transvaginal Non OB with LTD Doppler (Ovarian Torsion) [[id number]] Collected:  06/27/15 0002    Order Status:  Completed Updated:  06/27/15 0010    Narrative:      PELVIC ULTRASOUND WITH COLOR DOPPLER    CLINICAL STATEMENT: 29 year old woman. Clinical concern for retained  products of conception.    Status post delivery June 23, 2015. G2 P2. No prior tamoxifen, HRT  or C-section.    COMPARISON: No prior study is available for comparison.    TECHNIQUE: Transvaginal pelvic ultrasound was performed utilizing  grayscale and color Doppler.     FINDINGS: The uterus measures 16.3 x 8.5 x 10.6 cm, with ill-defined  endometrium without definite vascularity. The ovaries are not seen. The  pelvic cul-de-sac and adnexae are unremarkable.      Impression:       Heterogeneous material in the lower uterine segment of the  endometrial canal suggesting retained products of conception.    Aleen Sells, MD   06/27/2015 12:06 AM        .    Medical Decision Making   I am the first provider for this patient.    I reviewed the vital signs, available nursing notes, past medical history, past surgical history, family history and social history.    Vital Signs-Reviewed the patient's vital signs.     Patient Vitals for the past 12 hrs:   BP Temp Pulse Resp   06/26/15 2322 119/58 mmHg 99.6 F  (37.6 C) 72 18   06/26/15 2101 118/68 mmHg (!) 100.6 F (38.1 C) (!) 107 18       Pulse Oximetry Analysis - Normal 98% on RA    Old Medical Records: Old medical records.  Nursing notes.     ED Course:     11:11 PM - Discussed pt case with Dr. Awilda Metro, [facility], who recommends pelvic US.    11:29 PM - Updated patient on lab results and further treatment plan, she agrees.     12:15 AM - I was made aware that the wrong OBGYN group was paged. Dr. Awilda Metro was updated and is aware. Will page [facility]    12:20 AM - Discussed pt case with Dr. Lynann Bologna, Ob/Gyn, who recommends Keflex and f/u in office Monday. Return to ER if symptoms worsen including worsening bleeding or  breast changes.    12:24 AM - Update pt on further results, and follow up plan with her OBGYN, medication uses, and return precautions. Pt understandsd and agrees. She is comfortable to [place] home.    Provider Notes: 71 YOF with post partum fever x 1 day. T max 101 at home. Source of fever unclear at this time. Pt denies any complaints other than general malaise and chills. Pt with leukocytosis with L shift. WBC higher than 3 days ago. H/H stable. UA with WBC, RBC and leuks, possible UTI. Urine cx sent. Blood cx sent. IV rocephin given. US shows heterogenous material in lower  Uterine segment of endometrial canal suggesting retained products. Discussed results with Dr. Lynann Bologna. [place] results are "normal" post partum. If asymptomatic, okay for d/c home. Rx for keflex given. Return precaution givne.       Diagnosis     Clinical Impression:   1. Postpartum fever    2. Acute cystitis without hematuria        Treatment Plan:   ED Disposition     Discharge [name] discharge to home/self care.    Condition at disposition: Stable              _______________________________      Attestations: This note is prepared by Areatha Keas and Rosine Abe, acting as scribe for Audley Hose, MD. The scribe's documentation has been prepared  under my direction and personally reviewed by me in its entirety.  I confirm that the note above accurately reflects all work, treatment, procedures, and medical decision making performed by me.    _______________________________    Cherlyn Roberts, MD  06/27/15 1710

## 2015-06-26 NOTE — ED Notes (Signed)
Pt C/O fever and chills starting last night. Pt recently had a baby, and baby is in NICU. Pt is pumping, and reports breast tenderness, denies redness or heat on breasts.

## 2015-06-27 ENCOUNTER — Inpatient Hospital Stay: Payer: No Typology Code available for payment source | Admitting: Obstetrics

## 2015-06-27 ENCOUNTER — Inpatient Hospital Stay
Admission: EM | Admit: 2015-06-27 | Discharge: 2015-06-29 | DRG: 776 | Disposition: A | Discharge status: Home / Self Care | Payer: No Typology Code available for payment source | Attending: Obstetrics | Admitting: Obstetrics

## 2015-06-27 DIAGNOSIS — R7881 Bacteremia: Secondary | ICD-10-CM | POA: Diagnosis present

## 2015-06-27 DIAGNOSIS — O9089 Other complications of the puerperium, not elsewhere classified: Principal | ICD-10-CM | POA: Diagnosis present

## 2015-06-27 DIAGNOSIS — O864 Pyrexia of unknown origin following delivery: Secondary | ICD-10-CM

## 2015-06-27 LAB — COMPREHENSIVE METABOLIC PANEL
ALT: 18 U/L (ref 0–55)
AST (SGOT): 18 U/L (ref 5–34)
Albumin/Globulin Ratio: 0.7 — ABNORMAL LOW (ref 0.9–2.2)
Albumin: 2.4 g/dL — ABNORMAL LOW (ref 3.5–5.0)
Alkaline Phosphatase: 124 U/L — ABNORMAL HIGH (ref 37–106)
Anion Gap: 5 (ref 5.0–15.0)
BUN: 14 mg/dL (ref 7.0–19.0)
Bilirubin, Total: 0.2 mg/dL (ref 0.2–1.2)
CO2: 23 mEq/L (ref 22–29)
Calcium: 8.6 mg/dL (ref 8.5–10.5)
Chloride: 114 mEq/L — ABNORMAL HIGH (ref 100–111)
Creatinine: 0.8 mg/dL (ref 0.6–1.0)
Globulin: 3.4 g/dL (ref 2.0–3.6)
Glucose: 81 mg/dL (ref 70–100)
Potassium: 3.9 mEq/L (ref 3.5–5.1)
Protein, Total: 5.8 g/dL — ABNORMAL LOW (ref 6.0–8.3)
Sodium: 142 mEq/L (ref 136–145)

## 2015-06-27 LAB — CBC AND DIFFERENTIAL
Basophils Absolute Automated: 0.04 10*3/uL (ref 0.00–0.20)
Basophils Automated: 0 %
Eosinophils Absolute Automated: 0.4 10*3/uL (ref 0.00–0.70)
Eosinophils Automated: 2 %
Hematocrit: 31.6 % — ABNORMAL LOW (ref 37.0–47.0)
Hgb: 10.4 g/dL — ABNORMAL LOW (ref 12.0–16.0)
Immature Granulocytes Absolute: 0.13 10*3/uL — ABNORMAL HIGH
Immature Granulocytes: 1 %
Lymphocytes Absolute Automated: 2.56 10*3/uL (ref 0.50–4.40)
Lymphocytes Automated: 15 %
MCH: 28.3 pg (ref 28.0–32.0)
MCHC: 32.9 g/dL (ref 32.0–36.0)
MCV: 86.1 fL (ref 80.0–100.0)
MPV: 9.8 fL (ref 9.4–12.3)
Monocytes Absolute Automated: 1.01 10*3/uL (ref 0.00–1.20)
Monocytes: 6 %
Neutrophils Absolute: 12.65 10*3/uL — ABNORMAL HIGH (ref 1.80–8.10)
Neutrophils: 76 %
Nucleated RBC: 0 /100 WBC (ref 0–1)
Platelets: 348 10*3/uL (ref 140–400)
RBC: 3.67 10*6/uL — ABNORMAL LOW (ref 4.20–5.40)
RDW: 14 % (ref 12–15)
WBC: 16.66 10*3/uL — ABNORMAL HIGH (ref 3.50–10.80)

## 2015-06-27 LAB — URINE BHCG POC: Urine bHCG POC: POSITIVE — AB

## 2015-06-27 LAB — HEMOLYSIS INDEX: Hemolysis Index: 7 (ref 0–18)

## 2015-06-27 LAB — GFR: EGFR: >60

## 2015-06-27 MED ORDER — ONDANSETRON HCL 4 MG/2ML IJ SOLN
4.0000 mg | Freq: Three times a day (TID) | INTRAMUSCULAR | Status: AC | PRN
Start: 2015-06-27 — End: 2015-06-28

## 2015-06-27 MED ORDER — PRENATAL AD PO TABS
1.0000 | ORAL_TABLET | Freq: Every day | ORAL | Status: DC
Start: 2015-06-28 — End: 2015-06-29
  Administered 2015-06-28 – 2015-06-29 (×2): 1 via ORAL
  Filled 2015-06-27 (×2): qty 1

## 2015-06-27 MED ORDER — SODIUM CHLORIDE 0.9 % IV MBP
4.5000 g | Freq: Three times a day (TID) | INTRAVENOUS | Status: DC
Start: 2015-06-28 — End: 2015-06-28
  Administered 2015-06-28 (×2): 4.5 g via INTRAVENOUS
  Filled 2015-06-27 (×2): qty 100
  Filled 2015-06-27 (×2): qty 20

## 2015-06-27 MED ORDER — CEPHALEXIN 500 MG PO CAPS
500.0000 mg | ORAL_CAPSULE | Freq: Four times a day (QID) | ORAL | Status: DC
Start: 2015-06-27 — End: 2015-06-29

## 2015-06-27 MED ORDER — SODIUM CHLORIDE 0.9 % IV BOLUS
30.0000 mL/kg | Freq: Once | INTRAVENOUS | Status: AC
Start: 2015-06-27 — End: 2015-06-27
  Administered 2015-06-27: 2190 mL via INTRAVENOUS

## 2015-06-27 MED ORDER — IBUPROFEN 400 MG PO TABS
400.0000 mg | ORAL_TABLET | Freq: Four times a day (QID) | ORAL | Status: DC | PRN
Start: 2015-06-27 — End: 2015-06-29

## 2015-06-27 MED ORDER — SODIUM CHLORIDE 0.9 % IV MBP
4.5000 g | Freq: Once | INTRAVENOUS | Status: AC
Start: 2015-06-27 — End: 2015-06-27
  Administered 2015-06-27: 4.5 g via INTRAVENOUS
  Filled 2015-06-27: qty 100
  Filled 2015-06-27: qty 20

## 2015-06-27 MED ORDER — VANCOMYCIN HCL 1000 MG IV SOLR
1500.0000 mg | Freq: Once | INTRAVENOUS | Status: AC
Start: 2015-06-27 — End: 2015-06-27
  Administered 2015-06-27: 1500 mg via INTRAVENOUS
  Filled 2015-06-27: qty 1500

## 2015-06-27 MED ORDER — ACETAMINOPHEN 325 MG PO TABS
650.0000 mg | ORAL_TABLET | Freq: Four times a day (QID) | ORAL | Status: DC
Start: 2015-06-27 — End: 2015-06-27

## 2015-06-27 MED ORDER — ACETAMINOPHEN 325 MG PO TABS
650.0000 mg | ORAL_TABLET | Freq: Four times a day (QID) | ORAL | Status: DC | PRN
Start: 2015-06-27 — End: 2015-06-29

## 2015-06-27 MED ORDER — SODIUM CHLORIDE 0.9 % IV BOLUS
1000.0000 mL | Freq: Once | INTRAVENOUS | Status: AC
Start: 2015-06-27 — End: 2015-06-27
  Administered 2015-06-27: 1000 mL via INTRAVENOUS

## 2015-06-27 MED ORDER — VANCOMYCIN 1000 MG IN 250 ML NS IVPB VIAL-MATE (CNR)
1000.0000 mg | Freq: Two times a day (BID) | INTRAVENOUS | Status: DC
Start: 2015-06-28 — End: 2015-06-28
  Administered 2015-06-28: 1000 mg via INTRAVENOUS
  Filled 2015-06-27 (×2): qty 250

## 2015-06-27 MED ORDER — SODIUM CHLORIDE 0.9 % IV SOLN
100.0000 mL/h | INTRAVENOUS | Status: DC
Start: 2015-06-27 — End: 2015-06-27

## 2015-06-27 NOTE — Plan of Care (Signed)
Pt received from ED approx. 2100. Pt alert and oriented x 4, vital signs stable, pt denies reports of pain and ambulates with a steady gait. Breast pump at bedside. Pt oriented to room, call bell, fall & Safety plan and Plan of care. Will continue to monitor pt, assist with ADLs,  maintain pt safety and continue with plan of care. Plan of care discussed at length and pt spoke with Dr. Lynann Bologna about plan of care.

## 2015-06-27 NOTE — ED Provider Notes (Signed)
EMERGENCY DEPARTMENT HISTORY AND PHYSICAL EXAM     Physician/Midlevel provider first contact with patient: 06/27/15 1804         Date: 06/27/2015  Patient Name: [name]    History of Presenting Illness     Chief Complaint   Patient presents with   . Abnormal Lab     History Provided By: Patient    Chief Complaint: Called to return to ER for positive blood cultures  Onset: Cultures obtained 1 day ago, called back today  Location: Blood cultures  Quality: Positive blood cultures  Modifying factors: None  Associated Symptoms: None  Pertinent Negatives: Fever, nausea, vomiting, diarrhea, abd pain, vaginal discharge, vaginal bleeding, rhinorrhea, cough, congestion, neck pain, back pain, rash    Additional History: [name] is a 29 y.o. female who was seen 1 day ago for fever and discharged home was called back into the ER for positive blood cultures that were drawn 1 day ago. Pt states that she is feeling well and denies fever, nausea, vomiting, diarrhea, abd pain, vaginal discharge, vaginal bleeding, rhinorrhea, cough, congestion, neck pain, back pain, rash, or any other symptoms.      PCP: Nelle Don, MD      Current Facility-Administered Medications   Medication Dose Route Frequency Provider Last Rate Last Dose   . acetaminophen (TYLENOL) tablet 650 mg  650 mg Oral Q6H PRN Georgianne Fick, MD       . ibuprofen (ADVIL,MOTRIN) tablet 400 mg  400 mg Oral Q6H PRN Georgianne Fick, MD       . ondansetron [facility]) injection 4 mg  4 mg Intravenous Q8H PRN Cherlyn Roberts, MD       . piperacillin-tazobactam (ZOSYN) 4.5 g in sodium chloride 0.9 % 100 mL IVPB mini-bag plus  4.5 g Intravenous Q8H Georgianne Fick, MD       . prenatal vitamin tablet 1 tablet  1 tablet Oral Daily Georgianne Fick, MD       . vancomycin (VANCOCIN) 1000 mg in sodium chloride 0.9% 250 mL IVPB  1,000 mg Intravenous Q12H Georgianne Fick, MD           Past History     Past Medical History:  Past Medical History   Diagnosis Date    . Physical exam, annual 2011   . Vocal cord nodules    . GERD (gastroesophageal reflux disease)    . Premature rupture of membranes, onset of labour after 24 hours, antepartum condition or complication, preterm, third trimester 06/24/2013   . History of preterm labor    . History of preterm delivery, currently pregnant in second trimester 01/23/2015       Past Surgical History:  Past Surgical History   Procedure Laterality Date   . Esophagogastroduodenoscopy  2004     [place],    . Tonsillectomy     . Adenoidectomy         Family History:  Family History   Problem Relation Age of Onset   . Diabetes Father    . Hypertension Father    . Alcohol abuse Neg Hx    . Arthritis Neg Hx    . Asthma Neg Hx    . Birth defects Neg Hx    . Cancer Neg Hx    . COPD Neg Hx    . Depression Neg Hx    . Drug abuse Neg Hx    . Early death Neg Hx    .  Hearing loss Neg Hx    . Heart disease Neg Hx    . Hyperlipidemia Neg Hx    . Kidney disease Neg Hx    . Learning disabilities Neg Hx    . Mental retardation Neg Hx    . Miscarriages / Stillbirths Neg Hx    . Stroke Neg Hx    . Mental illness Neg Hx    . Vision loss Neg Hx        Social History:  Social History   Substance Use Topics   . Smoking status: Never Smoker    . Smokeless tobacco: Never Used   . Alcohol Use: 1.0 oz/week     2 Standard drinks or equivalent per week       Allergies:  Allergies   Allergen Reactions   . Bactrim [Sulfamethoxazole W/Trimethoprim (Co-Trimoxazole)] Hives       Review of Systems     Constitutional: Negative for fever or chills. +blood cultures  Neurological: Negative for speech changes, weakness, or numbness.  Eyes: Negative for visual changes or eye pain.  HENT: No headache. Negative for sore throat, neck pain, or runny nose.   Cardiovascular: Negative for chest pain.   Respiratory: Negative for shortness of breath.   Gastrointestinal: Negative for abdominal pain, nausea, vomiting, diarrhea, or blood in stool.   Genitourinary: Negative for dysuria or  hematuria.  Musculoskeletal: Negative for gait changes, joint pain or muscle pain.   Skin: Negative for itching or rash.   Hematological: Negative for easy bruising    Physical Exam   BP 121/78 mmHg  Pulse 78  Temp(Src) 98.3 F (36.8 C) (Oral)  Resp 17  Ht 5\' 1"  (1.549 m)  Wt 74.39 kg  BMI 31.00 kg/m2  SpO2 100%  Breastfeeding? Yes    Physical Exam   Constitutional: Oriented to person, place, and time and well-developed, well-nourished, and in no distress.   Head: Normocephalic and atraumatic.   Mouth/Throat: Oropharynx is clear and moist.   Eyes: Conjunctivae normal and EOM are normal. Pupils are equal, round, and reactive to light.    Neck: Normal range of motion. Neck supple. No thyromegaly present.   Cardiovascular: Normal rate, regular rhythm, normal heart sounds and intact distal pulses.  No murmur heard.  Pulmonary/Chest: Effort normal and breath sounds normal.   Abdominal: Soft. Non distended. Non tender. No rebound or guarding  Musculoskeletal: No peripheral edema. No calf swelling or tenderness.    Neurological: Patient is alert and oriented to person, place, and time. No cranial nerve deficit. Gait normal. GCS score is 15.   Skin: Skin is warm and dry. No rash  Psychiatric: Affect normal.     Diagnostic Study Results     Labs -     Results     Procedure Component Value Units Date/Time    Blood Culture Aerobic/Anaerobic #1 [[id number]] Collected:  06/27/15 1844    Specimen Information:  Arm from Blood Updated:  06/27/15 2029    Narrative:      1 BLUE+1 PURPLE    Blood Culture Aerobic/Anaerobic #2 [[id number]] Collected:  06/27/15 1844    Specimen Information:  Arm from Blood Updated:  06/27/15 2029    Narrative:      1 BLUE+1 PURPLE    GFR [[id number]] Collected:  06/27/15 1844     EGFR >60.0 Updated:  06/27/15 1913    Comprehensive metabolic panel [[id number]]  (Abnormal) Collected:  06/27/15 1844    Specimen Information:  Blood Updated:  06/27/15 1913  Glucose 81 mg/dL      BUN 16.1 mg/dL       Creatinine 0.8 mg/dL      Sodium 096 mEq/L      Potassium 3.9 mEq/L      Chloride 114 (H) mEq/L      CO2 23 mEq/L      Calcium 8.6 mg/dL      Protein, Total 5.8 (L) g/dL      Albumin 2.4 (L) g/dL      AST (SGOT) 18 U/L      ALT 18 U/L      Alkaline Phosphatase 124 (H) U/L      Bilirubin, Total 0.2 mg/dL      Globulin 3.4 g/dL      Albumin/Globulin Ratio 0.7 (L)      Anion Gap 5.0     Hemolysis index [[id number]] Collected:  06/27/15 1844     Hemolysis Index 7 Updated:  06/27/15 1913    CBC with differential [[id number]]  (Abnormal) Collected:  06/27/15 1844    Specimen Information:  Blood from Blood Updated:  06/27/15 1900     WBC 16.66 (H) x10 3/uL      Hgb 10.4 (L) g/dL      Hematocrit 21.3 (L) %      Platelets 348 x10 3/uL      RBC 3.67 (L) x10 6/uL      MCV 86.1 fL      MCH 28.3 pg      MCHC 32.9 g/dL      RDW 14 %      MPV 9.8 fL      Neutrophils 76 %      Lymphocytes Automated 15 %      Monocytes 6 %      Eosinophils Automated 2 %      Basophils Automated 0 %      Immature Granulocyte 1 %      Nucleated RBC 0 /100 WBC      Neutrophils Absolute 12.65 (H) x10 3/uL      Abs Lymph Automated 2.56 x10 3/uL      Abs Mono Automated 1.01 x10 3/uL      Abs Eos Automated 0.40 x10 3/uL      Absolute Baso Automated 0.04 x10 3/uL      Absolute Immature Granulocyte 0.13 (H) x10 3/uL     Urine BHCG POC [[id number]]  (Abnormal) Collected:  06/27/15 1830     Urine bHCG POC Positive (A) Updated:  06/27/15 1836          Radiologic Studies -   Radiology Results (24 Hour)     ** No results found for the last 24 hours. **      .    Medical Decision Making   I am the first provider for this patient.    I reviewed the vital signs, available nursing notes, past medical history, past surgical history, family history and social history.    Vital Signs-Reviewed the patient's vital signs.     Patient Vitals for the past 12 hrs:   BP Temp Pulse Resp   06/27/15 2059 121/78 mmHg 98.3 F (36.8 C) 78 17   06/27/15 2009 116/85 mmHg - 68 16    06/27/15 1801 116/59 mmHg (!) 96.7 F (35.9 C) 83 16       Pulse Oximetry Analysis - Normal 99% on RA    Admit Decision Time:  The decision to admit this patient was made by the emergency  provider at 8:18 PM 06/28/15.    Old Medical Records: Nursing notes.     ED Course:     6:50 PM - D/w Dr. Dan Humphreys (ID) recommends vanc and zosyn as well as admission.    7:28 PM - Updated patient on blood work as well as further plan for admission for IV abx, she understands and agrees.     8:18 PM - D/w Dr. Lynann Bologna (OBGYN) who agrees with admission (OBGYN Inpatient).     Provider Notes: 29 YOF 6 days post partum with fever, leukocytosis. Blood cx positive 1/2 tubes for gram pos cocci concerning for strep. Urine cx pending. IV vanc and zosyn given per recommendation of ID. Will admit.      Diagnosis     Clinical Impression:   1. Bacteremia    2. Postpartum fever        Treatment Plan:   ED Disposition     Admit Admitting Physician: Georgianne Fick [[id number]]  Diagnosis: Bacteremia [790.7.ICD-9-CM]  Estimated Length of Stay: > or = to 2 midnights  Tentative Discharge Plan?: Home or Self Care [1]  Patient Class: Inpatient [101]              _______________________________      Attestations: This note is prepared by Rosine Abe, acting as scribe for Audley Hose, MD. The scribe's documentation has been prepared under my direction and personally reviewed by me in its entirety.  I confirm that the note above accurately reflects all work, treatment, procedures, and medical decision making performed by me.    _______________________________            Cherlyn Roberts, MD  06/28/15 2020

## 2015-06-27 NOTE — ED Provider Notes (Addendum)
Called patient at 4:15 PM for  Positive blood culture 1/2 for gram postive cooci <24 hr. Pt had post partum fever x 1 day without obvious source. No breast TTP. No abdominal pain. No pelvic pain or abnormal discharge. No UTI sx. Pt was given IV rocephin in ED last night for Possible UTI. US showed possible retained products, however after discussion with with Dr. Lynann Bologna. [place] results are likely normal post partum. Safe for d/c home. Pt called back. Pt feeling better. Patient instructed to return to ED for reevaluation. IV abx for possible bacteremia given unclear source of infection. Informed Dr. Sharla Kidney at 4:20 PM of patient return to ED and need for admission.     Cherlyn Roberts, MD  06/27/15 1624    Cherlyn Roberts, MD  06/27/15 [phone]

## 2015-06-27 NOTE — ED Notes (Signed)
Pt said she just had her second baby this past Tuesday via vaginal delivery.

## 2015-06-27 NOTE — Discharge Instructions (Signed)
Fever Unclear Etiology    You have been seen for fever (temperature higher than 100.4F / 38C). The cause of your fever is not yet known.    Fevers are often caused by viruses (such as the cold virus). These fevers will go away when your body gets rid of the virus; usually after a few days to a week.    There are many other possible causes of fever. This is a list of some of the other possible causes:   Infection.   Inflammation.   Certain types of cancer (especially lymphoma).   Metabolic problems (problems with the way the body's cells work).   Certain poisons, drugs and medicines.    The main goal of treating a fever is to help you feel better and make body aches, headache, etc. go away. Acetaminophen (Tylenol) or ibuprofen (Advil or Motrin) are usually used to treat a fever. Other treatments may be needed, depending on what the doctor thinks is wrong.    Because fever causes you to lose extra water, you can become dehydrated very easily. Rest. Keep well-hydrated by drinking a lot of fluids. Do not drink soda, pop or other sugary drinks or drinks that have caffeine. Avoid alcohol and other drugs.    The doctor believes that it is OK for you to go home.    It is VERY IMPORTANT that you see your family doctor about your fever. You can also see the physician the medical staff may have referred you to.    YOU SHOULD SEEK MEDICAL ATTENTION IMMEDIATELY, EITHER HERE OR AT THE NEAREST EMERGENCY DEPARTMENT, IF ANY OF THE FOLLOWING OCCURS:   High fevers which are lasting for more than 5 days.   Headache and stiff neck.   Weakness, dizziness, passing out.   Confusion, disorientation or anything that looks like convulsions (a seizure).   Signs of dehydration (dry mouth, cracked lips, lightheadedness when standing, not making much urine).   Chest or abdominal pains.   Difficulty breathing or shortness of breath.   Rashes or bruising or bleeding for no reason.   Feeling worse or other new, concerning  symptoms.             Urinary Tract Infection    You have been diagnosed with a lower urinary tract infection (UTI). This is also called cystitis.    Cystitis is an infection in your bladder. Your doctor diagnosed it by testing your urine. Cystitis usually causes burning with urination or frequent urination. It might make you feel like you have to urinate even when you don't.     Cystitis is usually treated with antibiotics and medicine to help with pain.    It is VERY IMPORTANT that you fill your prescription and take all of the antibiotics as directed. If a lower urinary tract infection goes untreated for too long, it can become a kidney infection.    FOR WOMEN: To reduce the risk of getting cystitis again:   Always urinate before and after sexual intercourse.   Always wipe from front to back after urinating or having a bowel movement. Do not wipe from back to front.   Drink plenty of fluids. Try to drink cranberry or blueberry juice. These juices have a chemical that stops bacteria from "sticking" to the bladder.    YOU SHOULD SEEK MEDICAL ATTENTION IMMEDIATELY, EITHER HERE OR AT THE NEAREST EMERGENCY DEPARTMENT, IF ANY OF THE FOLLOWING OCCURS:   You have a fever (temperature higher than 100.4F / 38C)   or shaking chills.   You feel nauseated or vomit.   You have pain in your side or back.   You don't get better after taking all of your antibiotics.   You have any new symptoms or concerns.   You feel worse or do not improve.

## 2015-06-27 NOTE — ED Notes (Signed)
Pt was here in [facility] ed for a fever. Pt was called by dr. Maisie Fus today to come back to the ed for more test to be done. Pt said no fever today. Fever broke off last night.

## 2015-06-28 ENCOUNTER — Inpatient Hospital Stay: Payer: No Typology Code available for payment source

## 2015-06-28 MED ORDER — SODIUM CHLORIDE 0.9 % IV MBP
2.0000 g | INTRAVENOUS | Status: DC
Start: 2015-06-28 — End: 2015-06-29
  Administered 2015-06-28: 2 g via INTRAVENOUS
  Filled 2015-06-28 (×3): qty 2000

## 2015-06-28 NOTE — UM Notes (Signed)
Primary Payor: [employer] [phone] PO [id number] NON OPTIONS    06/27/15 2024  Admit to Inpatient Once    Order: [id number]     29 y.o. female who had a vaginal delivery 3 days ago with no complications is presenting to the ED with a fever that was as high as 101.77F since last night. Associated symptoms include diaphoresis, chills, and headache. She denies dysuria, abd pain, vaginal discharge, coughing, sore throat, rhinorrhea, rash, ear pain, breast erythema, breast drainage, or any other symptoms. Pt states that her newborn in in the NICU due to pna. Pt is GBS negative. Pt notes she has vaginal bleeding that is at baseline from the pregnancy.    Positive blood culture 1/2 for gram postive cooci <24 hr. Pt had post partum fever x 1 day without obvious source. No breast TTP. No abdominal pain. No pelvic pain or abnormal discharge. No UTI sx. Pt was given IV rocephin in ED last night for Possible UTI. US showed possible retained products, however after discussion with with Dr. Lynann Bologna. [place] results are likely normal post partum. Safe for d/c home. Pt called back. Pt feeling better. Patient instructed to return to ED for reevaluation. IV abx for possible bacteremia given unclear source of infection. Informed Dr. Sharla Kidney at 4:20 PM of patient return to ED and need for admission.     Physical Exam   BP 119/58 mmHg  Pulse 107  Temp(Src) 100.6 F (38.1C)  Resp 18  SpO2 98%        CO2 21 (L) mEq/L        Calcium 8.5 mg/dL      Protein, Total 5.7 (L) g/dL      Albumin 2.4 (L) g/dL      AST (SGOT) 22 U/L      ALT 18 U/L      Alkaline Phosphatase 127 (H) U/L      Bilirubin, Total 0.2 mg/dL      Globulin 3.3 g/dL      Albumin/Globulin Ratio 0.7 (L)                Leukocyte Esterase, UA Large (A)       Nitrite, UA Negative      Protein, UR 30 (A)      Glucose, UA Negative      Ketones UA Negative      Urobilinogen, UA Negative mg/dL       Bilirubin, UA Negative      Blood, UA Large (A)      RBC, UA TNTC (A) /hpf      WBC, UA TNTC (A) /hpf                WBC 18.27 (H) x10 3/uL       Hgb 10.6 (L) g/dL      Hematocrit 19.1 (L) %      Platelets 338 x10 3/uL      RBC 3.67 (L) x10 6/uL                 Pelvic US:       Impression:     Heterogeneous material in the lower uterine segment of the  endometrial canal suggesting retained products of conception.             ER MEDS:   06/27/2015 1845 sodium chloride 0.9 % bolus 1,000 mL 1,000 mL Intravenous [address] [place], [place]        06/27/2015 2036 sodium chloride 0.9 %  bolus 2,190 mL 0 mL Intravenous Stopped Estrella Deeds, RN       06/27/2015 1916 sodium chloride 0.9 % bolus 2,190 mL 2,190 mL Intravenous New Bag Estrella Deeds, RN       06/27/2015 1954 piperacillin-tazobactam (ZOSYN) 4.5 g in sodium chloride 0.9 % 100 mL IVPB mini-bag plus 0 g Intravenous Stopped Estrella Deeds, RN       06/27/2015 1916 piperacillin-tazobactam (ZOSYN) 4.5 g in sodium chloride 0.9 % 100 mL IVPB mini-bag plus 4.5 g Intravenous [address] Estrella Deeds, RN       06/27/2015 1958 vancomycin (VANCOCIN) 1,500 mg in sodium chloride 0.9 % 500 mL IVPB 1,500 mg Intravenous New Bag            MEDS:  piperacillin-tazobactam (ZOSYN) 4.5 g in sodium chloride 0.9 % 100 mL IVPB mini-bag plus  Dose: 4.5 g Freq: Every 8 hours Route: IV  Last Dose: 4.5 g (06/28/15 1150)    vancomycin (VANCOCIN) 1000 mg in sodium chloride 0.9% 250 mL IVPB  Dose: 1,000 mg Freq: Every 12 hours Route: IV  0.9% NaCl infusion  Rate: 100 mL/hr Freq: Continuous Route: IV      12/4-gyn note-  Assessment:   OB pt normal pre-natal care delivered 4 days ago baby in NICU with Pneumonia. Pt presented to the ED with fever & positive Blood cultures. ID consulted who rec Zosyn & Vancomycin.    Plan:     Admision IV antibiotics        Homero Fellers, RN, BSN, ACM, CCM  Utilization Review  Nurse  [facility]  [phone] [facility] Staff ONLY)  [phone] (Carriers)    Please submit all clinical review requests via fax to [phone]

## 2015-06-28 NOTE — Consults (Addendum)
INFECTIOUS DISEASES CONSULTATION  [facility]  [phone] [facility] [address]   [address]  [place], [place], [address]   [phone]      Date Time: 06/28/2015 3:49 PM  Patient Name: [name]  Requesting Physician: Joanne Fick, MD      Reason for Consultation:   Pneumococcal bacteremia.    Assessment:   Pt is s/p vaginal delivery 06/23/15, presented to ED 05/27/15 for Post partum fever, [name], sent home and called back for admission 06/27/15 for Pneumococcal bacteremia (not septic).  Pt has no obvious source of bacteremia (see below) including absence of RTI, meningitis, sinusitis, endometritis, chorioamnionitis, [name], [name]     Possible pathogenesis:  She was likely vaginally colonized with Pneumococcus and during labor passed bacteria to son (who developed bacteremic Pneumococcal PNA), then pt herself developed ascending complicated bacteremic Pneumococcal UTI.    Plan:   Change Vanco/Zosyn to CTX.  CXR and TTE.  Document BC clearance.  OK to breast feed.  Defer CT Douglas/P given absence of sys.  Further recommendations depend upon clinical course.  Thank you for allowing me to participate in your patient's care.    Dr. Venetia Douglas returns tomorrow, please call if questions/concerns rest of weekend.    History:   [name] is Douglas 29 y.o. female who presents to the hospital on 06/27/2015 with   1. Bacteremia    2. Postpartum fever      The pt recently had vaginal deliver via planned induction of labor on 06/23/15.  Pt had uncomplicated antepartum and peripartum course.  Amniotic fluid was clear.  Pt went home on 06/25/15.  She returned the following day for non-localizing acute fevers to 101.5, rigors/chills.  Pt had Douglas TV USN that showed possible retained POC (but felt to be doubtful per Dr. Lynann Douglas), abnormal u/Douglas, and given dose of CTX.  Pt was called back for admission d/t growth of GPCs in pair/chains.  BCs were repeated.  Pt was started on Vanco and Zosyn.  ID  has been asked to assist.    History was obtained by review of the available EPIC notes and discussion with Dr. Lynann Douglas.    Pt denies abdominal/pelvic pain, vaginal [place], dysuria, N/V, flank pain, breast pain, headaches, stiff neck, cough/congestion, mastalgia.    Denies any recent illness PTA.    Pt's neonate currently recovering from bacteremic pneumococcal PNA (CSF negative).    Past Medical History:     Past Medical History   Diagnosis Date   . Physical exam, annual 2011   . Vocal cord nodules    . GERD (gastroesophageal reflux disease)    . Premature rupture of membranes, onset of labour after 24 hours, antepartum condition or complication, preterm, third trimester 06/24/2013   . History of preterm labor    . History of preterm delivery, currently pregnant in second trimester 01/23/2015       Past Surgical History:     Past Surgical History   Procedure Laterality Date   . Esophagogastroduodenoscopy  2004     [place],    . Tonsillectomy     . Adenoidectomy         Family History:     Family History   Problem Relation Age of Onset   . Diabetes Father    . Hypertension Father    . Alcohol abuse Neg Hx    . Arthritis Neg Hx    . Asthma Neg Hx    .  Birth defects Neg Hx    . Cancer Neg Hx    . COPD Neg Hx    . Depression Neg Hx    . Drug abuse Neg Hx    . Early death Neg Hx    . Hearing loss Neg Hx    . Heart disease Neg Hx    . Hyperlipidemia Neg Hx    . Kidney disease Neg Hx    . Learning disabilities Neg Hx    . Mental retardation Neg Hx    . Miscarriages / Stillbirths Neg Hx    . Stroke Neg Hx    . Mental illness Neg Hx    . Vision loss Neg Hx        Social History:     Social History     Social History   . Marital Status: Married     Spouse Name: N/Douglas   . Number of Children: N/Douglas   . Years of Education: N/Douglas     Occupational History   . 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 Smoking status: Never Smoker    . Smokeless tobacco: Never Used   . Alcohol Use: 1.0 oz/week     2 Standard drinks  or equivalent per week   . Drug Use: No   . Sexual Activity:     Partners: Male     Birth Control/ Protection: None     Other Topics Concern   . Not on file     Social History Narrative    Married No child        Does do exercise       Allergies:     Allergies   Allergen Reactions   . Bactrim [Sulfamethoxazole W/Trimethoprim (Co-Trimoxazole)] Hives       Medications:     Current Facility-Administered Medications   Medication Dose Route Frequency   . piperacillin-tazobactam  4.5 g Intravenous Q8H   . prenatal vitamin  1 tablet Oral Daily   . vancomycin  1,000 mg Intravenous Q12H       Review of Systems:   As noted above.    Physical Exam:     Temp:  [97.6 F (36.4 C)-98.7 F (37.1 C)] 98.7 F (37.1 C)  Heart Rate:  [63-68] 64  Resp Rate:  [16] 16  BP: (108-122)/(66-84) 119/69 mmHg    Intake and Output Summary (Last 24 hours) at Date Time    Intake/Output Summary (Last 24 hours) at 06/28/15 1549  Last data filed at 06/28/15 1300   Gross per 24 hour   Intake   1350 ml   Output      0 ml   Net   1350 ml       Constitutional: well developed, well nourished, NRD/NAD.   HEENT: PERRL, EOMI, no thrush.   Neck: Supple. Full ROM.   Cardiovascular: regular rate and rhythm, no murmurs.  Pulmonary/Chest: NRD, clear to auscultation bilaterally, no wheezing/rales/rhonchi.   Abdominal: soft, non-distended, non-tender.   Back: no CVAT, no ST.   Extremities: trace edema, VS changes.  No digital emboli.  Skin: skin is warm and dry, no drug rash.   Neurological: alert, awake, oriented, normal speech, grossly NF.       Labs Reviewed:     Results     Procedure Component Value Units Date/Time    Blood Culture Aerobic/Anaerobic #1 [[id number]] Collected:  06/27/15 1844    Specimen Information:  Arm from  Blood Updated:  06/27/15 2029    Narrative:      1 BLUE+1 PURPLE    Blood Culture Aerobic/Anaerobic #2 [[id number]] Collected:  06/27/15 1844    Specimen Information:  Arm from Blood Updated:  06/27/15 2029    Narrative:      1 BLUE+1  PURPLE    GFR [[id number]] Collected:  06/27/15 1844     EGFR >60.0 Updated:  06/27/15 1913    Comprehensive metabolic panel [[id number]]  (Abnormal) Collected:  06/27/15 1844    Specimen Information:  Blood Updated:  06/27/15 1913     Glucose 81 mg/dL      BUN 08.6 mg/dL      Creatinine 0.8 mg/dL      Sodium 578 mEq/L      Potassium 3.9 mEq/L      Chloride 114 (H) mEq/L      CO2 23 mEq/L      Calcium 8.6 mg/dL      Protein, Total 5.8 (L) g/dL      Albumin 2.4 (L) g/dL      AST (SGOT) 18 U/L      ALT 18 U/L      Alkaline Phosphatase 124 (H) U/L      Bilirubin, Total 0.2 mg/dL      Globulin 3.4 g/dL      Albumin/Globulin Ratio 0.7 (L)      Anion Gap 5.0     Hemolysis index [[id number]] Collected:  06/27/15 1844     Hemolysis Index 7 Updated:  06/27/15 1913    CBC with differential [[id number]]  (Abnormal) Collected:  06/27/15 1844    Specimen Information:  Blood from Blood Updated:  06/27/15 1900     WBC 16.66 (H) x10 3/uL      Hgb 10.4 (L) g/dL      Hematocrit 27.2 (L) %      Platelets 348 x10 3/uL      RBC 3.67 (L) x10 6/uL      MCV 86.1 fL      MCH 28.3 pg      MCHC 32.9 g/dL      RDW 14 %      MPV 9.8 fL      Neutrophils 76 %      Lymphocytes Automated 15 %      Monocytes 6 %      Eosinophils Automated 2 %      Basophils Automated 0 %      Immature Granulocyte 1 %      Nucleated RBC 0 /100 WBC      Neutrophils Absolute 12.65 (H) x10 3/uL      Abs Lymph Automated 2.56 x10 3/uL      Abs Mono Automated 1.01 x10 3/uL      Abs Eos Automated 0.40 x10 3/uL      Absolute Baso Automated 0.04 x10 3/uL      Absolute Immature Granulocyte 0.13 (H) x10 3/uL     Urine BHCG POC [[id number]]  (Abnormal) Collected:  06/27/15 1830     Urine bHCG POC Positive (Douglas) Updated:  06/27/15 1836          Rads:     Radiology Results (24 Hour)     ** No results found for the last 24 hours. **           Signed by: Anice Paganini

## 2015-06-28 NOTE — Plan of Care (Signed)
Problem: Safety  Goal: Patient will be free from injury during hospitalization  Outcome: Progressing  Patient bed at lowest position, call bell within reach, side-rails x3 up, bed alarm on, other fall measures in place. pt instructed to call for any assistance. Will continue to hourly rounding to ensure safety and meet immediate needs.    Problem: Pain  Goal: Patient's pain/discomfort is manageable  Outcome: Progressing  Denies sob, discomfort, no nausea/vomiting. Medication administered as ordered and well tolerated. Pt ate 100 % lunch. Will continue to monitor.    Problem: Infection/Potential for Infection  Goal: Afebrile for 24 hours prior to discharge home.  Outcome: Progressing  Pt denies sweat/chill, VSS, no fever. Pt on IV ABT. Will continue to monitor.

## 2015-06-28 NOTE — H&P (Signed)
GYN ADMISSION HISTORY AND PHYSICAL EXAM    Date Time: 06/28/2015 8:37 AM  Patient Name: [name],[name] A  Attending Physician: Georgianne Fick, MD    CC: Abnormal Lab      History of Presenting Illness:   [name] is a 29 y.o. female who presents to the hospital with PP fever.    Past Medical History:   NSVD  4 days ago  Past Medical History   Diagnosis Date   . Physical exam, annual 2011   . Vocal cord nodules    . GERD (gastroesophageal reflux disease)    . Premature rupture of membranes, onset of labour after 24 hours, antepartum condition or complication, preterm, third trimester 06/24/2013   . History of preterm labor    . History of preterm delivery, currently pregnant in second trimester 01/23/2015       Past Surgical History:     Past Surgical History   Procedure Laterality Date   . Esophagogastroduodenoscopy  2004     [place],    . Tonsillectomy     . Adenoidectomy         OB History:     No LMP recorded. Patient is not currently having periods (Reason: Postpartum).  OB History     Gravida Para Term Preterm AB TAB SAB Ectopic Multiple Living    2 2 1 1      0 2          Medications      Prescriptions prior to admission   Medication Sig   . cephALEXin (KEFLEX) 500 MG capsule Take 1 capsule (500 mg total) by mouth 4 (four) times daily.   Marland Kitchen ibuprofen (ADVIL,MOTRIN) 200 MG tablet Take 600 mg by mouth every 6 (six) hours as needed for Pain.      . Cholecalciferol (VITAMIN D) 2000 UNITS Cap Take by mouth nightly.   . Prenatal Vit-Fe Fumarate-FA (PRENATAL VITAMIN PO) Take 1 tablet by mouth daily.       Allergies:     Allergies   Allergen Reactions   . Bactrim [Sulfamethoxazole W/Trimethoprim (Co-Trimoxazole)] Hives       Psychosocial / Family History:     Social History     Social History   . Marital Status: Married     Spouse Name: N/A   . Number of Children: N/A   . Years of Education: N/A     Occupational History   . 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 Smoking status: Never  Smoker    . Smokeless tobacco: Never Used   . Alcohol Use: 1.0 oz/week     2 Standard drinks or equivalent per week   . Drug Use: No   . Sexual Activity:     Partners: Male     Birth Control/ Protection: None     Other Topics Concern   . Not on file     Social History Narrative    Married No child        Does do exercise       Family History   Problem Relation Age of Onset   . Diabetes Father    . Hypertension Father    . Alcohol abuse Neg Hx    . Arthritis Neg Hx    . Asthma Neg Hx    . Birth defects Neg Hx    . Cancer Neg Hx    . COPD Neg Hx    .  Depression Neg Hx    . Drug abuse Neg Hx    . Early death Neg Hx    . Hearing loss Neg Hx    . Heart disease Neg Hx    . Hyperlipidemia Neg Hx    . Kidney disease Neg Hx    . Learning disabilities Neg Hx    . Mental retardation Neg Hx    . Miscarriages / Stillbirths Neg Hx    . Stroke Neg Hx    . Mental illness Neg Hx    . Vision loss Neg Hx        Review of Systems:     All negative.    Physical Exam:     Filed Vitals:    06/28/15 0708   BP: 108/66   Pulse: 63   Temp: 97.6 F (36.4 C)   Resp: 16   SpO2: 95%       Gen: NAD, AAOx3  HEENT: PERRLA  Lungs: CTA bilaterally  Cardiac: RRR  Abdomen: Soft, NT, ND, normal BS  Ext: No C/C/E  External Genitalia: no lesions  Vagina: Normal  Cervix: Normal  Uterus: Normal size  Adnexa: No mass or tenderness    Assessment:   OB pt normal pre-natal care delivered 4 days ago baby in NICU with Pneumonia.  Pt presented to the ED with fever & positive Blood cultures.  ID consulted who rec Zosyn & Vancomycin.    Plan:     Admision IV antibiotics   Good understanding of these considerations has been demonstrated and she wishes to proceed.        Signed by: Georgianne Fick, MD

## 2015-06-28 NOTE — Plan of Care (Signed)
Problem: Safety  Goal: Patient will be free from injury during hospitalization  Outcome: Progressing  Intervention: Assess patient's risk for falls and implement fall prevention plan of care per policy  .  Intervention: Provide and maintain safe environment  .  Intervention: Use appropriate transfer methods  .  Intervention: Ensure appropriate safety devices are available at the bedside  .  Intervention: Include patient/family/caregiver in decisions related to safety  .  Intervention: Hourly rounding.  .  Intervention: Assess for patient's risk for elopement and implement Elopement Risk plan per policy  .      Problem: Pain  Goal: Patient's pain/discomfort is manageable  Outcome: Progressing    Problem: Infection/Potential for Infection  Goal: Afebrile for 24 hours prior to discharge home.  Outcome: Progressing  Intervention: Monitor vital signs  .  Intervention: Maintain temperature within desired parameters.  .  Intervention: Administer medications as ordered  .  Intervention: Monitor labs  .      Problem: Breast Care  Goal: Breasts are soft with nipple integrity intact-vaginal delivery-recovery and post partum.  Outcome: Progressing    Problem: Parenting/Coping- Postpartum  Goal: Positive mother-baby interactions are observed.  Outcome: Progressing    Per NICU RN and MD (per RN) ok for pt to visit baby in NICU.

## 2015-06-29 ENCOUNTER — Encounter (INDEPENDENT_AMBULATORY_CARE_PROVIDER_SITE_OTHER): Payer: Self-pay

## 2015-06-29 ENCOUNTER — Inpatient Hospital Stay: Payer: No Typology Code available for payment source

## 2015-06-29 DIAGNOSIS — R7881 Bacteremia: Secondary | ICD-10-CM

## 2015-06-29 MED ORDER — AMOXICILLIN 500 MG PO TABS
500.0000 mg | ORAL_TABLET | Freq: Three times a day (TID) | ORAL | Status: DC
Start: 2015-06-29 — End: 2017-11-09

## 2015-06-29 NOTE — Discharge Summary (Addendum)
No complaint. Denies N/V, SOB/CP. Tol clears. +flatus. Pain well controlled    Filed Vitals:    06/29/15 0642   BP: 97/53   Pulse: 63   Temp: 96.8 F (36 C)   Resp: 18   SpO2: 98%        Gen: NAD  Abd: S, NT, ND +BS  Pelvic: Normal lochiae  Ext: SCD's on, no Homan's    Results     Procedure Component Value Units Date/Time    Blood Culture Aerobic/Anaerobic #1 [[id number]] Collected:  06/27/15 1844    Specimen Information:  Arm from Blood Updated:  06/28/15 2122    Narrative:      ORDER#: [id number]                                    ORDERED BY: Audley Hose  SOURCE: Blood arm                                    COLLECTED:  06/27/15 18:44  ANTIBIOTICS AT COLL.:                                RECEIVED :  06/27/15 20:29  Culture Blood Aerobic and Anaerobic        PRELIM      06/28/15 21:21  06/28/15   No Growth after 1 day/s of incubation.      Blood Culture Aerobic/Anaerobic #2 [[id number]] Collected:  06/27/15 1844    Specimen Information:  Arm from Blood Updated:  06/28/15 2122    Narrative:      ORDER#: [id number]                                    ORDERED BY: Audley Hose  SOURCE: Blood arm                                    COLLECTED:  06/27/15 18:44  ANTIBIOTICS AT COLL.:                                RECEIVED :  06/27/15 20:29  Culture Blood Aerobic and Anaerobic        PRELIM      06/28/15 21:21  06/28/15   No Growth after 1 day/s of incubation.            HD #2 s/p  NSVD 6 d   Pt admitted with Septicemia but after 48 hrs cultures negative , case discussed with Dr. Venetia Constable who agreed with the discharge and Antibiotics x 7 days.  Plan:   Advance diet as tolerated  D/C HOME  Routine PP care in 2 wks with PFW/ PO actibiotic x 7 days

## 2015-06-29 NOTE — Progress Notes (Signed)
[facility] No CM Needs    Madelyn Brunner, MSW   Ext: [phone]       06/29/15 1433   Discharge Disposition   Physical Discharge Disposition Home, No Needs   CM Interventions   Multidisciplinary rounds/family meeting before d/c? No   Medicare Checklist   Is this a Medicare patient? No

## 2015-06-29 NOTE — Progress Notes (Signed)
Pt alert/oriented x4. Pt denies pain, no sob,n/v, abd. Pain. No chill/sweat reported. VSS. Pt received discharge instruction and medication script. Pt verbalized understanding of instruction. Pt took all belonging. Pt stable at time of discharge.

## 2015-06-29 NOTE — Discharge Instr - Diet (Signed)
Regular diet

## 2015-06-29 NOTE — Plan of Care (Signed)
Problem: Psychosocial and Spiritual Needs  Goal: Demonstrates ability to cope with hospitalization/illness  Outcome: Progressing  Pt is calm and cooperative.  Pt verbalized comfort and understanding to continued IVabx.  Family member visited during shift, pt was able to spend quality time with family. Pt able to provide milk for infant and ambulate upstairs to visit infant before bed time. Encouraged pt to share any concerns or questions.  Pt denied any anxiety or pain.    Will continue to provide support for pt.    Problem: Infection/Potential for Infection  Goal: Afebrile for 24 hours prior to discharge home.  Outcome: Progressing  Pt is alert and oriented x4. WBC elevated.  IVabx.  Education given.  Pt denied any pain, nausea or vomiting.   VSS.  Pt remained afebrile during shift.    Will continue plan of care.

## 2015-06-29 NOTE — Plan of Care (Signed)
Pt will discharge today

## 2015-06-29 NOTE — Discharge Instructions (Signed)
Amoxicillin Trihydrate Oral tablet  What is this medicine?  AMOXICILLIN (a mox i SIL in) is a penicillin antibiotic. It is used to treat certain kinds of bacterial infections. It will not work for colds, flu, or other viral infections.  This medicine may be used for other purposes; ask your health care provider or pharmacist if you have questions.  What should I tell my health care provider before I take this medicine?  They need to know if you have any of these conditions:   asthma   kidney disease   an unusual or allergic reaction to amoxicillin, other penicillins, cephalosporin antibiotics, other medicines, foods, dyes, or preservatives   pregnant or trying to get pregnant   breast-feeding  How should I use this medicine?  Take this medicine by mouth with a glass of water. Follow the directions on your prescription label. You may take this medicine with food or on an empty stomach. Take your medicine at regular intervals. Do not take your medicine more often than directed. Take all of your medicine as directed even if you think your are better. Do not skip doses or stop your medicine early.  Talk to your pediatrician regarding the use of this medicine in children. While this drug may be prescribed for selected conditions, precautions do apply.  Overdosage: If you think you have taken too much of this medicine contact a poison control center or emergency room at once.  NOTE: This medicine is only for you. Do not share this medicine with others.  What if I miss a dose?  If you miss a dose, take it as soon as you can. If it is almost time for your next dose, take only that dose. Do not take double or extra doses.  What may interact with this medicine?   amiloride   birth control pills   chloramphenicol   macrolides   probenecid   sulfonamides   tetracyclines  This list may not describe all possible interactions. Give your health care provider a list of all the medicines, herbs, non-prescription drugs, or  dietary supplements you use. Also tell them if you smoke, drink alcohol, or use illegal drugs. Some items may interact with your medicine.  What should I watch for while using this medicine?  Tell your doctor or health care professional if your symptoms do not improve in 2 or 3 days. Take all of the doses of your medicine as directed. Do not skip doses or stop your medicine early.  If you are diabetic, you may get a false positive result for sugar in your urine with certain brands of urine tests. Check with your doctor.  Do not treat diarrhea with over-the-counter products. Contact your doctor if you have diarrhea that lasts more than 2 days or if the diarrhea is severe and watery.  What side effects may I notice from receiving this medicine?  Side effects that you should report to your doctor or health care professional as soon as possible:   allergic reactions like skin rash, itching or hives, swelling of the face, lips, or tongue   breathing problems   dark urine   redness, blistering, peeling or loosening of the skin, including inside the mouth   seizures   severe or watery diarrhea   trouble passing urine or change in the amount of urine   unusual bleeding or bruising   unusually weak or tired   yellowing of the eyes or skin  Side effects that usually do not   require medical attention (report to your doctor or health care professional if they continue or are bothersome):   dizziness   headache   stomach upset   trouble sleeping  This list may not describe all possible side effects. Call your doctor for medical advice about side effects. You may report side effects to FDA at 1-800-FDA-1088.  Where should I keep my medicine?  Keep out of the reach of children.  Store between 68 and 77 degrees F (20 and 25 degrees C). Keep bottle closed tightly. Throw away any unused medicine after the expiration date.  NOTE:This sheet is a summary. It may not cover all possible information. If you have questions  about this medicine, talk to your doctor, pharmacist, or health care provider. Copyright 2015 Gold Standard

## 2015-06-29 NOTE — Discharge Instr - Activity (Signed)
As tolerated

## 2015-06-29 NOTE — Progress Notes (Signed)
ED Visit Follow Up Call      Facilty:     Discharge Date:    Primary Discharge Dx:    Prior ER Visits/Hospitalizations (past yr):    Follow Up Appt with PCP/Specialist :   ____/___/____      Outside care ordered:  (Home Health, PT, OT)     Have you been contacted to arrange care?; Yes or No    Patient Questions:    What symptoms made you go to the ED?:    How are you feeling today after your recent ED visit?:    Do you have any new or worsening symptoms since being seen?  If so, describe your symptoms and what you have tried to relieve your symptoms.    What instructions did they give you at discharge?:    Do you have your printed copy of your discharge instructions?:     What questions do you have regarding your instructions?     What new medications were prescribed at your visit or what changes to your medications were made?     What questions do you have regarding your discharge medications?    Do you have assistance at home and transportation to appointments?        Advised to follow up as instructed.  Patient / family member verbalizes understanding and has no other questions or concerns.   Instructed to call back if symptoms change or worsen and/or go to the emergency room or call 911 for emergency symptoms.    Patient has not been seen in office since December 2013

## 2015-06-29 NOTE — Plan of Care (Signed)
Problem: Pain  Goal: Patient's pain/discomfort is manageable  Outcome: Progressing  Denies sob, discomfort, no nausea/vomiting. Medication administered as ordered and well tolerated. Pt ate 100 % lunch. Will continue to monitor.        Problem: Infection/Potential for Infection  Goal: Afebrile for 24 hours prior to discharge home.  Outcome: Progressing  Pt denies sweat/chill, VSS, no fever. Pt on IV ABT. Will continue to monitor

## 2015-06-29 NOTE — Progress Notes (Signed)
INFECTIOUS DISEASES PROGRESS NOTE    Date Time: 06/29/2015 9:36 AM  Patient Name: [name],[name]  Patient status.Inpatient  Hospital Day: 2    Assessment and Plan:   1.  Pneumococcal bacteremia;?  Significance  -.  Repeat cultures are negative    Plan: 1.  Currently on ceftriaxone  2.  Can be discharged on 7 days of amoxicillin  3.  Can breast-feed    Subjective:   Nontoxic    Review of Systems:   Review of Systems - negative    Antibiotics:   As above    Other medications reviewed in EPIC.  Central Access:   No    Physical Exam:     Filed Vitals:    06/29/15 0642   BP: 97/53   Pulse: 63   Temp: 96.8 F (36 C)   Resp: 18   SpO2: 98%       Lungs: Clear.  Heart: No murmur.  Abdomen: Soft    Labs:     Microbiology Results     Procedure Component Value Units Date/Time    Blood Culture Aerobic/Anaerobic #1 [[id number]] Collected:  06/27/15 1844    Specimen Information:  Arm from Blood Updated:  06/28/15 2122    Narrative:      ORDER#: [id number]                                    ORDERED BY: Audley Hose  SOURCE: Blood arm                                    COLLECTED:  06/27/15 18:44  ANTIBIOTICS AT COLL.:                                RECEIVED :  06/27/15 20:29  Culture Blood Aerobic and Anaerobic        PRELIM      06/28/15 21:21  06/28/15   No Growth after 1 day/s of incubation.      Blood Culture Aerobic/Anaerobic #2 [[id number]] Collected:  06/27/15 1844    Specimen Information:  Arm from Blood Updated:  06/28/15 2122    Narrative:      ORDER#: [id number]                                    ORDERED BY: Audley Hose  SOURCE: Blood arm                                    COLLECTED:  06/27/15 18:44  ANTIBIOTICS AT COLL.:                                RECEIVED :  06/27/15 20:29  Culture Blood Aerobic and Anaerobic        PRELIM      06/28/15 21:21  06/28/15   No Growth after 1 day/s of incubation.            CBC w/Diff CMP     Recent Labs  Lab 06/27/15  1844 06/26/15  2201 06/23/15  1920  WBC 16.66* 18.27* 16.63*   HGB  10.4* 10.6* 11.5*   HEMATOCRIT 31.6* 31.4* 33.7*   PLATELETS 348 338 312   MCV 86.1 85.6 84.5   NEUTROPHILS 76 78 75       PT/INR           Recent Labs  Lab 06/27/15  1844 06/26/15  2201   SODIUM 142 141   POTASSIUM 3.9 4.0   CHLORIDE 114* 111   CO2 23 21*   BUN 14.0 13.0   CREATININE 0.8 0.9   GLUCOSE 81 96   CALCIUM 8.6 8.5   PROTEIN, TOTAL 5.8* 5.7*   ALBUMIN 2.4* 2.4*   AST (SGOT) 18 22   ALT 18 18   ALKALINE PHOSPHATASE 124* 127*   BILIRUBIN, TOTAL 0.2 0.2      Glucose POCT     Recent Labs  Lab 06/27/15  1844 06/26/15  2201   GLUCOSE 81 96          Rads:     Radiology Results (24 Hour)     Procedure Component Value Units Date/Time    XR Chest 2 Views [[id number]] Collected:  06/28/15 1654    Order Status:  Completed Updated:  06/28/15 1659    Narrative:      INDICATION: bacteremia, PNA    TECHNIQUE:  PA and lateral radiographs of the chest were obtained.      FINDINGS:  The heart size is within normal limits.  The lungs are clear  of acute disease.  There is no pneumothorax or pleural effusion.        Impression:        No evidence of acute process.    Aquilla Hacker, MD   06/28/2015 4:55 PM              Signed by: Zachery Dauer

## 2015-07-03 NOTE — Retrospective Coding Query (Signed)
PHYSICIAN'S DOCUMENTATION                                                                      REQUEST                                                                         Date of Request:  07/03/2015  Type of Request:  DOCUMENTATION CLARIFICATION                                         Patient Name: [name], [name]  Account #: [id number]  MR #: [id number]  Discharge Date: 06/29/2015      Dear Dr. Harriette Ohara:    Medical Record Reflects  Pt admitted with positive blood cultures and  fever 4 days s/p NSVD.    Consult Dr. Charlynne Pander 12/04: Pt . called back for admission 06/27/15 for Pneumococcal bacteremia  (not septic) ...  Bacteremic Pneumococcal UTI.    PN Dr. Venetia Constable 12/5: 1.  Pneumococcal bacteremia;?  Significance -.  Repeat cultures are negative   D/C Summary: Pt admitted with Septicemia but after 48 hrs cultures negative , case discussed with Dr. Venetia Constable who agreed with the discharge and Antibiotics x 7 days.    Request to Physician  Please clarify which diagnosis accurately reflects the patient's condition:      __ bacteremia   __ septicemia   __ other (please specify)   __ unable to determine/unknown    PHYSICIAN RESPONSE:                     Coder Waylan Boga  Date 07/03/2015

## 2015-07-06 NOTE — Retrospective Coding Query (Signed)
PHYSICIAN'S DOCUMENTATION    REQUEST       Date of Request: 07/03/2015  Type of Request: DOCUMENTATION CLARIFICATION        Patient Name: [name], [name]  Account #: [id number]  MR #: [id number]  Discharge Date: 06/29/2015      Dear Dr. Harriette Ohara:    Thank you for signing the prior query.  Unfortunately, I did not find a documented response so this query is being returned to you.  You may respond with an addendum to the Discharge Summary or directly on this query.      Medical Record Reflects  Pt admitted with positive blood cultures and fever 4 days s/p NSVD.   Consult Dr. Charlynne Pander 12/04: Pt . called back for admission 06/27/15 for Pneumococcal bacteremia (not septic) ... Bacteremic Pneumococcal UTI.   PN Dr. Venetia Constable 12/5: 1. Pneumococcal bacteremia;? Significance -. Repeat cultures are negative   D/C Summary: Pt admitted with Septicemia but after 48 hrs cultures negative , case discussed with Dr. Venetia Constable who agreed with the discharge and Antibiotics x 7 days.    Request to Physician  Please clarify which diagnosis accurately reflects the patient's condition:     __ bacteremia   __ septicemia   __ other (please specify)   __ unable to determine/unknown    PHYSICIAN RESPONSE:     If you read Dr. Venetia Constable notes, as I write I deferred the Dx to him will be Bacteremia.  Thank you.              Doristine Locks

## 2018-06-13 LAB — OB RESULTS CONSOLE ABO/RH: RH Type: POSITIVE

## 2018-06-13 LAB — OB RESULTS CONSOLE HEPATITIS B SURFACE ANTIGEN: Hepatitis B Surface Ag: NEGATIVE

## 2018-06-13 LAB — OB RESULTS CONSOLE RUBELLA ANTIBODY, IGM: Rubella: IMMUNE

## 2018-06-13 LAB — OB RESULTS CONSOLE RPR: RPR: NONREACTIVE

## 2018-06-13 LAB — OB RESULTS CONSOLE HIV ANTIBODY (ROUTINE TESTING): HIV: NONREACTIVE

## 2018-06-13 LAB — OB RESULTS CONSOLE ANTIBODY SCREEN: Antibody Screen: NEGATIVE

## 2018-07-18 ENCOUNTER — Other Ambulatory Visit: Payer: Self-pay

## 2018-08-22 ENCOUNTER — Encounter (HOSPITAL_COMMUNITY): Payer: Self-pay

## 2018-09-12 ENCOUNTER — Encounter (HOSPITAL_COMMUNITY): Payer: Self-pay | Admitting: *Deleted

## 2018-09-14 ENCOUNTER — Other Ambulatory Visit: Payer: Self-pay

## 2018-09-14 ENCOUNTER — Encounter (HOSPITAL_COMMUNITY): Payer: Self-pay

## 2018-09-14 ENCOUNTER — Other Ambulatory Visit (HOSPITAL_COMMUNITY): Payer: Self-pay | Admitting: *Deleted

## 2018-09-14 ENCOUNTER — Ambulatory Visit (HOSPITAL_COMMUNITY)
Admission: RE | Admit: 2018-09-14 | Discharge: 2018-09-14 | Disposition: A | Discharge status: Home / Self Care | Payer: BLUE CROSS/BLUE SHIELD | Source: Ambulatory Visit | Attending: Obstetrics and Gynecology | Admitting: Obstetrics and Gynecology

## 2018-09-14 ENCOUNTER — Ambulatory Visit (HOSPITAL_BASED_OUTPATIENT_CLINIC_OR_DEPARTMENT_OTHER)
Admission: RE | Admit: 2018-09-14 | Discharge: 2018-09-14 | Disposition: A | Discharge status: Home / Self Care | Payer: BLUE CROSS/BLUE SHIELD | Source: Ambulatory Visit | Attending: Obstetrics and Gynecology | Admitting: Obstetrics and Gynecology

## 2018-09-14 DIAGNOSIS — Z362 Encounter for other antenatal screening follow-up: Secondary | ICD-10-CM

## 2018-09-14 DIAGNOSIS — Z3A21 21 weeks gestation of pregnancy: Secondary | ICD-10-CM | POA: Diagnosis not present

## 2018-09-14 DIAGNOSIS — Z8751 Personal history of pre-term labor: Secondary | ICD-10-CM

## 2018-09-14 DIAGNOSIS — O98512 Other viral diseases complicating pregnancy, second trimester: Secondary | ICD-10-CM

## 2018-09-14 DIAGNOSIS — O98812 Other maternal infectious and parasitic diseases complicating pregnancy, second trimester: Secondary | ICD-10-CM | POA: Diagnosis present

## 2018-09-14 DIAGNOSIS — O283 Abnormal ultrasonic finding on antenatal screening of mother: Secondary | ICD-10-CM | POA: Diagnosis not present

## 2018-09-14 DIAGNOSIS — B343 Parvovirus infection, unspecified: Secondary | ICD-10-CM

## 2018-09-14 HISTORY — DX: Other specified health status: Z78.9

## 2018-09-14 NOTE — Consult Note (Addendum)
Maternal-Fetal Medicine Name: Denise Orr MRN: [id number] Requesting Provider: Harold Hedge, MD  I had the pleasure of seeing [facility] today at [facility]. She was accompanied by her husband. As you know, she is G3 P2 at 21-weeks' gestation with recent Parvovirus B19 infection.  Her first son had fifth disease and had rashes about 2 to 3 weeks ago. She had "sore joints" and later rashes on her arms and legs. She did not have fever. Blood drawn on 09/05/2018 showed increased IgG (3.4) and increased IgM (11.6) levels.  Prenatal course has otherwise been uneventful. On cell-free fetal DNA screening, the risks of fetal aneuploidies are not increased. On fetal anatomy scan performed at your office, an echogenic intracardiac focus was seen. Patient is taking weekly 17-OH progesterone prophylactic injections.  PMH: No history of hypertension or diabetes or any other chronic medical conditions. PSH: Tonsillectomy in childhood. Medications: Prenatal vitamins, Makena. Allergies: Bactrim (rashes) Social: Denies tobacco or drug or alcohol use. She is married and her husband (father of all her children) is in good health. Family: No history of venous thromboembolism in the family.  Obstetric history: -06/2013: PPROM and vaginal delivery at 46 weeks' gestation of a female infant weighing 4+ lbs. He is in good health. -05/2015: Term vaginal delivery of a female infant weighing 6 lbs at birth. He had GBS pneumonia (early-onset GBS infection). He is in good health now. Patient delivered in [place], [place]. Gyn: No history of abnormal Pap smears or cervical surgeries.  Ultrasound: A limited ultrasound study was performed. Amniotic fluid is normal and good fetal activity is seen. No evidence of fetal hydrops. Middle-cerebral artery (MCA) Doppler showed normal peak-systolic velocity measurements (1.17 MoM). No evidence of fetal anemia. On transabdominal scan, the cervix looks normal.  I  counseled the couple on the following: Parvovirus B19 infection in pregnancy: Patient has/had recent Parvo infection. Following are strong indicators: 1) increased IgG and IgM antibodies, 2) patient had symptoms of arthralgia and rashes, 3) her son had parvo infection ("fifth disease) 2 to 3 weeks ago. -Infection is acquired/transmitted before the clinical symptoms of rashes. -Increased IgG levels indicate infection from 7 days to 6 months. IgM increase first in about 3 weeks after exposure to virus followed by IgG (3 to 4 weeks after exposure). -Infection occurs by respiratory route, contact and from mother to the fetus. -About 30% to 50% of pregnant women who acquire the virus have transplacental transmission. -About one-third of pregnant women are nonimmune to parvovirus. -Only a small number of fetuses are infected. If infection is acquired in the first 20 weeks, the fetal loss is higher (6% to 10%); if infection occurs after 20 weeks, the fetal loss is very low (less than 1%). -Fetal infection can cause red cell destruction and bone marrow suppression leading to fetal anemia. -Fetal anemia can be diagnosed by noninvasive method of middle-cerebral artery Doppler study. -We recommend weekly ultrasound to perform MCA Doppler studies for a total of 12 weeks to rule out fetal anemia. About 90% of fetal anemia are evident by 8 weeks after exposure. Since the patient must have acquired infection about 3 weeks ago, it is reasonable to perform MCA Doppler for 8 to 9 weeks from now. -I discussed management of fetal anemia that requires confirmation by fetal blood sampling and, if anemia is confirmed fetal blood transfusion is indicated. Usually, one transfusion is enough and we would expect survival in more than 80% of cases. -Some cases of fetal anemia resolve  spontaneously. -Patient will have lifelong immunity after this infection.  History of preterm delivery: Patient had one preterm delivery and took  vaginal progesterone in her previous pregnancy and delivered at term. I counseled her that history of preterm delivery increases the risk of recurrent preterm delivery. I discussed the benefit of 17-OH progesterone (Makena) that is likely to reduce the risk of recurrent preterm delivery. I reassured the finding of normal cervical length on transabdominal scan. Previous infant with GBS pneumonia: Patient informed that her second son (term delivery) had GBS pneumonia. She also reported (or unsure) that she screened negative for GBS during pregnancy. If neonatal records are not available from the previous hospital, I recommend penicillin prophylaxis during labor in this pregnancy.  Echogenic intracardiac focus: I counseled the patient that echogenic focus is seen in about 3% to 4% of normal fetuses (more in Asian population), and in about 15%-20% of fetuses with Down syndrome. She was reassured that echogenic focus is not associated with any structural heart malformations. Presence of this isolated marker only slightly increases the a priori risk for Down syndrome.  I informed the patient that given that she had low risk for fetal aneuploidies on cell-free fetal DNA screening, finding of echogenic intracardiac focus should be considered a normal variant and that the risk of trisomy 21 is not increased. I also reassured that echogenic focus does not increase the risk of cardiac defects. I informed the patient that only amniocentesis will give a definitive result on the fetal karyotype and the procedure-related loss is about 1 in 500 or less.  Recommendations: -Weekly MCA Doppler studies. -Detailed fetal anatomy and cervical length measurement next week. -If there is no evidence of anemia after 9 weeks' surveillance, follow-up scans are not necessary. -Intrapartum penicillin G prophylaxis regardless of GBS screening result. -Continue 17-OH progesterone prophylaxis till 36 weeks.  Thank you for your  consult. Please do not hesitate to contact me if you have any questions or concerns.  Consultation including face-to-face counseling: 45 min.

## 2018-09-18 ENCOUNTER — Ambulatory Visit (HOSPITAL_COMMUNITY): Payer: Self-pay

## 2018-09-18 ENCOUNTER — Other Ambulatory Visit (HOSPITAL_COMMUNITY): Payer: Self-pay | Admitting: *Deleted

## 2018-09-18 DIAGNOSIS — O98519 Other viral diseases complicating pregnancy, unspecified trimester: Secondary | ICD-10-CM

## 2018-09-18 NOTE — Progress Notes (Signed)
Us/

## 2018-09-24 ENCOUNTER — Ambulatory Visit (HOSPITAL_COMMUNITY): Payer: BLUE CROSS/BLUE SHIELD | Admitting: *Deleted

## 2018-09-24 ENCOUNTER — Ambulatory Visit (HOSPITAL_COMMUNITY)
Admission: RE | Admit: 2018-09-24 | Discharge: 2018-09-24 | Disposition: A | Discharge status: Home / Self Care | Payer: BLUE CROSS/BLUE SHIELD | Source: Ambulatory Visit | Attending: Obstetrics and Gynecology | Admitting: Obstetrics and Gynecology

## 2018-09-24 ENCOUNTER — Other Ambulatory Visit: Payer: Self-pay

## 2018-09-24 ENCOUNTER — Encounter (HOSPITAL_COMMUNITY): Payer: Self-pay

## 2018-09-24 VITALS — BP 114/69 | HR 84 | Wt 159.8 lb

## 2018-09-24 DIAGNOSIS — O0990 Supervision of high risk pregnancy, unspecified, unspecified trimester: Secondary | ICD-10-CM

## 2018-09-24 DIAGNOSIS — O98512 Other viral diseases complicating pregnancy, second trimester: Secondary | ICD-10-CM

## 2018-09-24 DIAGNOSIS — O09212 Supervision of pregnancy with history of pre-term labor, second trimester: Secondary | ICD-10-CM | POA: Diagnosis not present

## 2018-09-24 DIAGNOSIS — Z3A23 23 weeks gestation of pregnancy: Secondary | ICD-10-CM | POA: Diagnosis not present

## 2018-09-24 DIAGNOSIS — O98519 Other viral diseases complicating pregnancy, unspecified trimester: Secondary | ICD-10-CM | POA: Diagnosis not present

## 2018-09-24 DIAGNOSIS — Z363 Encounter for antenatal screening for malformations: Secondary | ICD-10-CM

## 2018-10-01 ENCOUNTER — Encounter (HOSPITAL_COMMUNITY): Payer: Self-pay

## 2018-10-01 ENCOUNTER — Ambulatory Visit (HOSPITAL_COMMUNITY): Payer: BLUE CROSS/BLUE SHIELD | Admitting: *Deleted

## 2018-10-01 ENCOUNTER — Ambulatory Visit (HOSPITAL_COMMUNITY)
Admission: RE | Admit: 2018-10-01 | Discharge: 2018-10-01 | Disposition: A | Discharge status: Home / Self Care | Payer: BLUE CROSS/BLUE SHIELD | Source: Ambulatory Visit | Attending: Obstetrics and Gynecology | Admitting: Obstetrics and Gynecology

## 2018-10-01 VITALS — BP 118/66 | HR 80 | Wt 159.0 lb

## 2018-10-01 DIAGNOSIS — O98512 Other viral diseases complicating pregnancy, second trimester: Secondary | ICD-10-CM

## 2018-10-01 DIAGNOSIS — Z20828 Contact with and (suspected) exposure to other viral communicable diseases: Secondary | ICD-10-CM

## 2018-10-01 DIAGNOSIS — O98519 Other viral diseases complicating pregnancy, unspecified trimester: Secondary | ICD-10-CM | POA: Diagnosis present

## 2018-10-01 DIAGNOSIS — Z3A24 24 weeks gestation of pregnancy: Secondary | ICD-10-CM

## 2018-10-05 ENCOUNTER — Encounter (HOSPITAL_COMMUNITY): Payer: Self-pay

## 2018-10-08 ENCOUNTER — Other Ambulatory Visit: Payer: Self-pay

## 2018-10-08 ENCOUNTER — Ambulatory Visit (HOSPITAL_COMMUNITY)
Admission: RE | Admit: 2018-10-08 | Discharge: 2018-10-08 | Disposition: A | Discharge status: Home / Self Care | Payer: BLUE CROSS/BLUE SHIELD | Source: Ambulatory Visit | Attending: Obstetrics and Gynecology | Admitting: Obstetrics and Gynecology

## 2018-10-08 ENCOUNTER — Encounter (HOSPITAL_COMMUNITY): Payer: Self-pay

## 2018-10-08 ENCOUNTER — Ambulatory Visit (HOSPITAL_COMMUNITY): Payer: BLUE CROSS/BLUE SHIELD | Admitting: *Deleted

## 2018-10-08 VITALS — BP 108/64 | HR 72 | Temp 98.1°F | Wt 162.8 lb

## 2018-10-08 DIAGNOSIS — O0990 Supervision of high risk pregnancy, unspecified, unspecified trimester: Secondary | ICD-10-CM | POA: Diagnosis present

## 2018-10-08 DIAGNOSIS — O09212 Supervision of pregnancy with history of pre-term labor, second trimester: Secondary | ICD-10-CM

## 2018-10-08 DIAGNOSIS — O98512 Other viral diseases complicating pregnancy, second trimester: Secondary | ICD-10-CM

## 2018-10-08 DIAGNOSIS — Z3A25 25 weeks gestation of pregnancy: Secondary | ICD-10-CM

## 2018-10-08 DIAGNOSIS — O98519 Other viral diseases complicating pregnancy, unspecified trimester: Secondary | ICD-10-CM | POA: Insufficient documentation

## 2018-10-15 ENCOUNTER — Encounter (HOSPITAL_COMMUNITY): Payer: Self-pay

## 2018-10-15 ENCOUNTER — Other Ambulatory Visit (HOSPITAL_COMMUNITY): Payer: Self-pay | Admitting: *Deleted

## 2018-10-15 ENCOUNTER — Ambulatory Visit (HOSPITAL_COMMUNITY)
Admission: RE | Admit: 2018-10-15 | Discharge: 2018-10-15 | Disposition: A | Discharge status: Home / Self Care | Payer: BLUE CROSS/BLUE SHIELD | Source: Ambulatory Visit | Attending: Obstetrics and Gynecology | Admitting: Obstetrics and Gynecology

## 2018-10-15 ENCOUNTER — Other Ambulatory Visit: Payer: Self-pay

## 2018-10-15 ENCOUNTER — Ambulatory Visit (HOSPITAL_COMMUNITY): Payer: BLUE CROSS/BLUE SHIELD | Admitting: *Deleted

## 2018-10-15 VITALS — Temp 98.4°F

## 2018-10-15 DIAGNOSIS — O98519 Other viral diseases complicating pregnancy, unspecified trimester: Secondary | ICD-10-CM | POA: Diagnosis not present

## 2018-10-15 DIAGNOSIS — O98513 Other viral diseases complicating pregnancy, third trimester: Secondary | ICD-10-CM

## 2018-10-15 DIAGNOSIS — O0990 Supervision of high risk pregnancy, unspecified, unspecified trimester: Secondary | ICD-10-CM | POA: Insufficient documentation

## 2018-10-15 DIAGNOSIS — Z3A26 26 weeks gestation of pregnancy: Secondary | ICD-10-CM | POA: Diagnosis not present

## 2018-10-15 DIAGNOSIS — B343 Parvovirus infection, unspecified: Secondary | ICD-10-CM

## 2018-10-22 ENCOUNTER — Other Ambulatory Visit: Payer: Self-pay

## 2018-10-22 ENCOUNTER — Ambulatory Visit (HOSPITAL_COMMUNITY)
Admission: RE | Admit: 2018-10-22 | Discharge: 2018-10-22 | Disposition: A | Discharge status: Home / Self Care | Payer: BLUE CROSS/BLUE SHIELD | Source: Ambulatory Visit | Attending: Obstetrics and Gynecology | Admitting: Obstetrics and Gynecology

## 2018-10-22 ENCOUNTER — Encounter (HOSPITAL_COMMUNITY): Payer: Self-pay

## 2018-10-22 ENCOUNTER — Ambulatory Visit (HOSPITAL_COMMUNITY): Payer: BLUE CROSS/BLUE SHIELD | Admitting: *Deleted

## 2018-10-22 VITALS — BP 107/60 | HR 82 | Temp 98.3°F

## 2018-10-22 DIAGNOSIS — B343 Parvovirus infection, unspecified: Secondary | ICD-10-CM | POA: Insufficient documentation

## 2018-10-22 DIAGNOSIS — O98512 Other viral diseases complicating pregnancy, second trimester: Secondary | ICD-10-CM

## 2018-10-22 DIAGNOSIS — O98519 Other viral diseases complicating pregnancy, unspecified trimester: Secondary | ICD-10-CM | POA: Diagnosis present

## 2018-10-22 DIAGNOSIS — O0990 Supervision of high risk pregnancy, unspecified, unspecified trimester: Secondary | ICD-10-CM | POA: Insufficient documentation

## 2018-10-22 DIAGNOSIS — Z3A27 27 weeks gestation of pregnancy: Secondary | ICD-10-CM

## 2018-10-22 DIAGNOSIS — O09212 Supervision of pregnancy with history of pre-term labor, second trimester: Secondary | ICD-10-CM

## 2018-10-29 ENCOUNTER — Encounter (HOSPITAL_COMMUNITY): Payer: Self-pay

## 2018-10-29 ENCOUNTER — Other Ambulatory Visit (HOSPITAL_COMMUNITY): Payer: Self-pay | Admitting: *Deleted

## 2018-10-29 ENCOUNTER — Other Ambulatory Visit: Payer: Self-pay

## 2018-10-29 ENCOUNTER — Ambulatory Visit (HOSPITAL_COMMUNITY)
Admission: RE | Admit: 2018-10-29 | Discharge: 2018-10-29 | Disposition: A | Discharge status: Home / Self Care | Payer: BLUE CROSS/BLUE SHIELD | Source: Ambulatory Visit | Attending: Obstetrics and Gynecology | Admitting: Obstetrics and Gynecology

## 2018-10-29 ENCOUNTER — Ambulatory Visit (HOSPITAL_COMMUNITY): Payer: BLUE CROSS/BLUE SHIELD | Admitting: *Deleted

## 2018-10-29 VITALS — Temp 97.9°F

## 2018-10-29 DIAGNOSIS — Z3A28 28 weeks gestation of pregnancy: Secondary | ICD-10-CM | POA: Diagnosis not present

## 2018-10-29 DIAGNOSIS — O09213 Supervision of pregnancy with history of pre-term labor, third trimester: Secondary | ICD-10-CM

## 2018-10-29 DIAGNOSIS — B343 Parvovirus infection, unspecified: Secondary | ICD-10-CM | POA: Diagnosis present

## 2018-10-29 DIAGNOSIS — O0990 Supervision of high risk pregnancy, unspecified, unspecified trimester: Secondary | ICD-10-CM | POA: Insufficient documentation

## 2018-10-29 DIAGNOSIS — O98513 Other viral diseases complicating pregnancy, third trimester: Secondary | ICD-10-CM

## 2018-10-29 DIAGNOSIS — O98519 Other viral diseases complicating pregnancy, unspecified trimester: Secondary | ICD-10-CM | POA: Insufficient documentation

## 2018-10-29 DIAGNOSIS — Z362 Encounter for other antenatal screening follow-up: Secondary | ICD-10-CM

## 2018-11-05 ENCOUNTER — Ambulatory Visit (HOSPITAL_COMMUNITY): Payer: BLUE CROSS/BLUE SHIELD

## 2018-11-12 ENCOUNTER — Encounter (HOSPITAL_COMMUNITY): Payer: Self-pay

## 2018-11-12 ENCOUNTER — Other Ambulatory Visit: Payer: Self-pay

## 2018-11-12 ENCOUNTER — Ambulatory Visit (HOSPITAL_COMMUNITY)
Admission: RE | Admit: 2018-11-12 | Discharge: 2018-11-12 | Disposition: A | Discharge status: Home / Self Care | Payer: BLUE CROSS/BLUE SHIELD | Source: Ambulatory Visit | Attending: Obstetrics and Gynecology | Admitting: Obstetrics and Gynecology

## 2018-11-12 ENCOUNTER — Ambulatory Visit (HOSPITAL_COMMUNITY): Payer: BLUE CROSS/BLUE SHIELD | Admitting: *Deleted

## 2018-11-12 VITALS — Temp 98.1°F

## 2018-11-12 DIAGNOSIS — O0990 Supervision of high risk pregnancy, unspecified, unspecified trimester: Secondary | ICD-10-CM | POA: Diagnosis present

## 2018-11-12 DIAGNOSIS — Z3A30 30 weeks gestation of pregnancy: Secondary | ICD-10-CM

## 2018-11-12 DIAGNOSIS — O98513 Other viral diseases complicating pregnancy, third trimester: Secondary | ICD-10-CM | POA: Diagnosis not present

## 2018-11-12 DIAGNOSIS — B343 Parvovirus infection, unspecified: Secondary | ICD-10-CM | POA: Insufficient documentation

## 2018-11-12 DIAGNOSIS — Z362 Encounter for other antenatal screening follow-up: Secondary | ICD-10-CM | POA: Insufficient documentation

## 2018-11-12 DIAGNOSIS — O98519 Other viral diseases complicating pregnancy, unspecified trimester: Secondary | ICD-10-CM | POA: Diagnosis present

## 2018-12-25 LAB — OB RESULTS CONSOLE GBS: GBS: NEGATIVE

## 2019-01-04 ENCOUNTER — Telehealth (HOSPITAL_COMMUNITY): Payer: Self-pay | Admitting: *Deleted

## 2019-01-04 ENCOUNTER — Encounter (HOSPITAL_COMMUNITY): Payer: Self-pay | Admitting: *Deleted

## 2019-01-04 NOTE — Telephone Encounter (Signed)
Preadmission screen  

## 2019-01-14 ENCOUNTER — Other Ambulatory Visit: Payer: Self-pay

## 2019-01-14 ENCOUNTER — Other Ambulatory Visit (HOSPITAL_COMMUNITY)
Admission: RE | Admit: 2019-01-14 | Discharge: 2019-01-14 | Disposition: A | Discharge status: Home / Self Care | Payer: BLUE CROSS/BLUE SHIELD | Source: Ambulatory Visit | Attending: Obstetrics and Gynecology | Admitting: Obstetrics and Gynecology

## 2019-01-14 DIAGNOSIS — Z1159 Encounter for screening for other viral diseases: Secondary | ICD-10-CM | POA: Diagnosis present

## 2019-01-14 NOTE — MAU Note (Signed)
Asymptomatic, swab collected without problem. 

## 2019-01-15 ENCOUNTER — Other Ambulatory Visit (HOSPITAL_COMMUNITY)
Admission: RE | Admit: 2019-01-15 | Discharge: 2019-01-15 | Disposition: A | Discharge status: Home / Self Care | Payer: BLUE CROSS/BLUE SHIELD | Source: Ambulatory Visit | Attending: Obstetrics and Gynecology | Admitting: Obstetrics and Gynecology

## 2019-01-15 ENCOUNTER — Encounter (HOSPITAL_COMMUNITY): Payer: Self-pay | Admitting: *Deleted

## 2019-01-15 ENCOUNTER — Other Ambulatory Visit: Payer: Self-pay

## 2019-01-15 ENCOUNTER — Inpatient Hospital Stay (HOSPITAL_COMMUNITY)
Admission: AD | Admit: 2019-01-15 | Discharge: 2019-01-17 | DRG: 807 | Disposition: A | Discharge status: Home / Self Care | Payer: BLUE CROSS/BLUE SHIELD | Attending: Obstetrics and Gynecology | Admitting: Obstetrics and Gynecology

## 2019-01-15 ENCOUNTER — Other Ambulatory Visit (HOSPITAL_COMMUNITY): Payer: Self-pay

## 2019-01-15 DIAGNOSIS — Z1159 Encounter for screening for other viral diseases: Secondary | ICD-10-CM | POA: Diagnosis not present

## 2019-01-15 DIAGNOSIS — Z3490 Encounter for supervision of normal pregnancy, unspecified, unspecified trimester: Secondary | ICD-10-CM

## 2019-01-15 DIAGNOSIS — O9902 Anemia complicating childbirth: Principal | ICD-10-CM | POA: Diagnosis present

## 2019-01-15 DIAGNOSIS — Z3A39 39 weeks gestation of pregnancy: Secondary | ICD-10-CM

## 2019-01-15 DIAGNOSIS — D649 Anemia, unspecified: Secondary | ICD-10-CM | POA: Diagnosis present

## 2019-01-15 LAB — CBC
HCT: 32.3 % — ABNORMAL LOW (ref 36.0–46.0)
Hemoglobin: 11 g/dL — ABNORMAL LOW (ref 12.0–15.0)
MCH: 29.5 pg (ref 26.0–34.0)
MCHC: 34.1 g/dL (ref 30.0–36.0)
MCV: 86.6 fL (ref 80.0–100.0)
Platelets: 315 10*3/uL (ref 150–400)
RBC: 3.73 MIL/uL — ABNORMAL LOW (ref 3.87–5.11)
RDW: 12.7 % (ref 11.5–15.5)
WBC: 12.2 10*3/uL — ABNORMAL HIGH (ref 4.0–10.5)
nRBC: 0 % (ref 0.0–0.2)

## 2019-01-15 LAB — TYPE AND SCREEN
ABO/RH(D): A POS
Antibody Screen: NEGATIVE

## 2019-01-15 LAB — SARS CORONAVIRUS 2 (TAT 6-24 HRS): SARS Coronavirus 2: NEGATIVE

## 2019-01-15 MED ORDER — LACTATED RINGERS IV SOLN
INTRAVENOUS | Status: DC
  Administered 2019-01-15 – 2019-01-16 (×2): via INTRAVENOUS

## 2019-01-15 MED ORDER — PENICILLIN G 3 MILLION UNITS IVPB - SIMPLE MED
3.0000 10*6.[IU] | INTRAVENOUS | Status: DC
  Administered 2019-01-16 (×2): 3 10*6.[IU] via INTRAVENOUS
  Filled 2019-01-15 (×13): qty 100

## 2019-01-15 MED ORDER — SODIUM CHLORIDE 0.9 % IV SOLN
5.0000 10*6.[IU] | Freq: Once | INTRAVENOUS | Status: AC
  Administered 2019-01-15: 5 10*6.[IU] via INTRAVENOUS
  Filled 2019-01-15: qty 5

## 2019-01-15 NOTE — H&P (Signed)
Denise Orr is a 33 y.o. female presenting for IOL at term. Pregnancy complicated by AMA>Panorama normal female. Also parvovirus in pregnancy. MFM followed with serial MCA>normal and normal growth. Delivery #2 had GBBS pneumonia. This pregnancy GBBS negative. But will treat with PCN anyway. OB History    Gravida  3   Para  2   Term  1   Preterm  1   AB      Living  2     SAB      TAB      Ectopic      Multiple      Live Births             Past Medical History:  Diagnosis Date  . Medical history non-contributory    Past Surgical History:  Procedure Laterality Date  . TONSILLECTOMY     Family History: family history is not on file. Social History:  reports that she has never smoked. She has never used smokeless tobacco. She reports previous alcohol use. She reports that she does not use drugs.     Maternal Diabetes: No Genetic Screening: Normal Maternal Ultrasounds/Referrals: Other:EIF>MFM  Fetal Ultrasounds or other Referrals:  Referred to Materal Fetal Medicine  Maternal Substance Abuse:  No Significant Maternal Medications:  None Significant Maternal Lab Results:  Group B Strep negative Other Comments:  None  Review of Systems  Eyes: Negative for blurred vision.  Gastrointestinal: Negative for abdominal pain.  Neurological: Negative for headaches.   Maternal Medical History:  Fetal activity: Perceived fetal activity is normal.        Last menstrual period 04/06/2018. Maternal Exam:  Abdomen: Fetal presentation: vertex     Physical Exam  Cardiovascular: Normal rate and regular rhythm.  Respiratory: Effort normal and breath sounds normal.  GI: Soft.  Neurological: She has normal reflexes.    Prenatal labs: ABO, Rh: A/Positive/-- (11/20 0000) Antibody: Negative (11/20 0000) Rubella: Immune (11/20 0000) RPR: Nonreactive (11/20 0000)  HBsAg: Negative (11/20 0000)  HIV: Non-reactive (11/20 0000)  GBS: Negative (06/02 0000)    Assessment/Plan: 33 yo G3P2 at term for IOL PCN for GBBS prophylaxis   [name] II 01/15/2019, 12:38 PM

## 2019-01-15 NOTE — MAU Note (Signed)
covid swab collected

## 2019-01-16 ENCOUNTER — Inpatient Hospital Stay (HOSPITAL_COMMUNITY): Payer: BLUE CROSS/BLUE SHIELD

## 2019-01-16 ENCOUNTER — Other Ambulatory Visit: Payer: Self-pay

## 2019-01-16 ENCOUNTER — Inpatient Hospital Stay (HOSPITAL_COMMUNITY): Payer: BLUE CROSS/BLUE SHIELD | Admitting: Anesthesiology

## 2019-01-16 DIAGNOSIS — O26893 Other specified pregnancy related conditions, third trimester: Secondary | ICD-10-CM | POA: Diagnosis present

## 2019-01-16 DIAGNOSIS — O9902 Anemia complicating childbirth: Secondary | ICD-10-CM | POA: Diagnosis present

## 2019-01-16 DIAGNOSIS — Z3A39 39 weeks gestation of pregnancy: Secondary | ICD-10-CM | POA: Diagnosis not present

## 2019-01-16 DIAGNOSIS — Z3490 Encounter for supervision of normal pregnancy, unspecified, unspecified trimester: Secondary | ICD-10-CM

## 2019-01-16 DIAGNOSIS — D649 Anemia, unspecified: Secondary | ICD-10-CM | POA: Diagnosis present

## 2019-01-16 LAB — CBC
HCT: 33 % — ABNORMAL LOW (ref 36.0–46.0)
Hemoglobin: 11.1 g/dL — ABNORMAL LOW (ref 12.0–15.0)
MCH: 29.4 pg (ref 26.0–34.0)
MCHC: 33.6 g/dL (ref 30.0–36.0)
MCV: 87.5 fL (ref 80.0–100.0)
Platelets: 285 10*3/uL (ref 150–400)
RBC: 3.77 MIL/uL — ABNORMAL LOW (ref 3.87–5.11)
RDW: 12.9 % (ref 11.5–15.5)
WBC: 18.4 10*3/uL — ABNORMAL HIGH (ref 4.0–10.5)
nRBC: 0 % (ref 0.0–0.2)

## 2019-01-16 LAB — RPR: RPR Ser Ql: NONREACTIVE

## 2019-01-16 LAB — ABO/RH: ABO/RH(D): A POS

## 2019-01-16 MED ORDER — OXYTOCIN 40 UNITS IN NORMAL SALINE INFUSION - SIMPLE MED: 2.5000 [IU]/h | INTRAVENOUS | Status: DC

## 2019-01-16 MED ORDER — FLEET ENEMA 7-19 GM/118ML RE ENEM: 1.0000 | ENEMA | RECTAL | Status: DC | PRN

## 2019-01-16 MED ORDER — OXYCODONE-ACETAMINOPHEN 5-325 MG PO TABS: 2.0000 | ORAL_TABLET | ORAL | Status: DC | PRN

## 2019-01-16 MED ORDER — ONDANSETRON HCL 4 MG/2ML IJ SOLN: 4.0000 mg | INTRAMUSCULAR | Status: DC | PRN

## 2019-01-16 MED ORDER — ACETAMINOPHEN 325 MG PO TABS: 650.0000 mg | ORAL_TABLET | ORAL | Status: DC | PRN

## 2019-01-16 MED ORDER — SOD CITRATE-CITRIC ACID 500-334 MG/5ML PO SOLN: 30.0000 mL | ORAL | Status: DC | PRN

## 2019-01-16 MED ORDER — LACTATED RINGERS IV SOLN: 500.0000 mL | Freq: Once | INTRAVENOUS | Status: DC

## 2019-01-16 MED ORDER — ZOLPIDEM TARTRATE 5 MG PO TABS: 5.0000 mg | ORAL_TABLET | Freq: Every evening | ORAL | Status: DC | PRN

## 2019-01-16 MED ORDER — SENNOSIDES-DOCUSATE SODIUM 8.6-50 MG PO TABS
2.0000 | ORAL_TABLET | ORAL | Status: DC
  Administered 2019-01-17: 2 via ORAL
  Filled 2019-01-16: qty 2

## 2019-01-16 MED ORDER — OXYTOCIN BOLUS FROM INFUSION
500.0000 mL | Freq: Once | INTRAVENOUS | Status: AC
  Administered 2019-01-16: 500 mL via INTRAVENOUS

## 2019-01-16 MED ORDER — PRENATAL MULTIVITAMIN CH
1.0000 | ORAL_TABLET | Freq: Every day | ORAL | Status: DC
  Administered 2019-01-16 – 2019-01-17 (×2): 1 via ORAL
  Filled 2019-01-16 (×2): qty 1

## 2019-01-16 MED ORDER — LACTATED RINGERS IV SOLN: INTRAVENOUS | Status: DC

## 2019-01-16 MED ORDER — SODIUM CHLORIDE (PF) 0.9 % IJ SOLN
INTRAMUSCULAR | Status: DC | PRN
  Administered 2019-01-16: 12 mL/h via EPIDURAL

## 2019-01-16 MED ORDER — COCONUT OIL OIL: 1.0000 "application " | TOPICAL_OIL | Status: DC | PRN

## 2019-01-16 MED ORDER — OXYTOCIN 40 UNITS IN NORMAL SALINE INFUSION - SIMPLE MED
1.0000 m[IU]/min | INTRAVENOUS | Status: DC
  Administered 2019-01-16: 2 m[IU]/min via INTRAVENOUS
  Filled 2019-01-16: qty 1000

## 2019-01-16 MED ORDER — OXYCODONE HCL 5 MG PO TABS: 10.0000 mg | ORAL_TABLET | ORAL | Status: DC | PRN

## 2019-01-16 MED ORDER — ONDANSETRON HCL 4 MG/2ML IJ SOLN: 4.0000 mg | Freq: Four times a day (QID) | INTRAMUSCULAR | Status: DC | PRN

## 2019-01-16 MED ORDER — WITCH HAZEL-GLYCERIN EX PADS: 1.0000 "application " | MEDICATED_PAD | CUTANEOUS | Status: DC | PRN

## 2019-01-16 MED ORDER — TERBUTALINE SULFATE 1 MG/ML IJ SOLN: 0.2500 mg | Freq: Once | INTRAMUSCULAR | Status: DC | PRN

## 2019-01-16 MED ORDER — PENICILLIN G 3 MILLION UNITS IVPB - SIMPLE MED: 3.0000 10*6.[IU] | INTRAVENOUS | Status: DC

## 2019-01-16 MED ORDER — DIPHENHYDRAMINE HCL 50 MG/ML IJ SOLN: 12.5000 mg | INTRAMUSCULAR | Status: DC | PRN

## 2019-01-16 MED ORDER — OXYCODONE-ACETAMINOPHEN 5-325 MG PO TABS: 1.0000 | ORAL_TABLET | ORAL | Status: DC | PRN

## 2019-01-16 MED ORDER — BUTORPHANOL TARTRATE 1 MG/ML IJ SOLN: 1.0000 mg | INTRAMUSCULAR | Status: DC | PRN

## 2019-01-16 MED ORDER — EPHEDRINE 5 MG/ML INJ: 10.0000 mg | INTRAVENOUS | Status: DC | PRN

## 2019-01-16 MED ORDER — LIDOCAINE HCL (PF) 1 % IJ SOLN
INTRAMUSCULAR | Status: DC | PRN
  Administered 2019-01-16 (×2): 4 mL via EPIDURAL

## 2019-01-16 MED ORDER — IBUPROFEN 600 MG PO TABS
600.0000 mg | ORAL_TABLET | Freq: Four times a day (QID) | ORAL | Status: DC
  Administered 2019-01-16 – 2019-01-17 (×5): 600 mg via ORAL
  Filled 2019-01-16 (×5): qty 1

## 2019-01-16 MED ORDER — LIDOCAINE HCL (PF) 1 % IJ SOLN
30.0000 mL | INTRAMUSCULAR | Status: DC | PRN
  Filled 2019-01-16: qty 30

## 2019-01-16 MED ORDER — PHENYLEPHRINE 40 MCG/ML (10ML) SYRINGE FOR IV PUSH (FOR BLOOD PRESSURE SUPPORT)
80.0000 ug | PREFILLED_SYRINGE | INTRAVENOUS | Status: DC | PRN
  Filled 2019-01-16: qty 10

## 2019-01-16 MED ORDER — PHENYLEPHRINE 40 MCG/ML (10ML) SYRINGE FOR IV PUSH (FOR BLOOD PRESSURE SUPPORT): 80.0000 ug | PREFILLED_SYRINGE | INTRAVENOUS | Status: DC | PRN

## 2019-01-16 MED ORDER — TETANUS-DIPHTH-ACELL PERTUSSIS 5-2.5-18.5 LF-MCG/0.5 IM SUSP: 0.5000 mL | Freq: Once | INTRAMUSCULAR | Status: DC

## 2019-01-16 MED ORDER — LACTATED RINGERS IV SOLN: 500.0000 mL | INTRAVENOUS | Status: DC | PRN

## 2019-01-16 MED ORDER — DIPHENHYDRAMINE HCL 25 MG PO CAPS: 25.0000 mg | ORAL_CAPSULE | Freq: Four times a day (QID) | ORAL | Status: DC | PRN

## 2019-01-16 MED ORDER — OXYCODONE HCL 5 MG PO TABS: 5.0000 mg | ORAL_TABLET | ORAL | Status: DC | PRN

## 2019-01-16 MED ORDER — LIDOCAINE HCL (PF) 1 % IJ SOLN: 30.0000 mL | INTRAMUSCULAR | Status: DC | PRN

## 2019-01-16 MED ORDER — BENZOCAINE-MENTHOL 20-0.5 % EX AERO
1.0000 "application " | INHALATION_SPRAY | CUTANEOUS | Status: DC | PRN
  Administered 2019-01-16: 1 via TOPICAL
  Filled 2019-01-16: qty 56

## 2019-01-16 MED ORDER — SIMETHICONE 80 MG PO CHEW: 80.0000 mg | CHEWABLE_TABLET | ORAL | Status: DC | PRN

## 2019-01-16 MED ORDER — DIBUCAINE (PERIANAL) 1 % EX OINT: 1.0000 "application " | TOPICAL_OINTMENT | CUTANEOUS | Status: DC | PRN

## 2019-01-16 MED ORDER — SODIUM CHLORIDE 0.9 % IV SOLN: 5.0000 10*6.[IU] | Freq: Once | INTRAVENOUS | Status: DC

## 2019-01-16 MED ORDER — FENTANYL-BUPIVACAINE-NACL 0.5-0.125-0.9 MG/250ML-% EP SOLN
12.0000 mL/h | EPIDURAL | Status: DC | PRN
  Filled 2019-01-16: qty 250

## 2019-01-16 MED ORDER — OXYTOCIN BOLUS FROM INFUSION: 500.0000 mL | Freq: Once | INTRAVENOUS | Status: DC

## 2019-01-16 MED ORDER — ONDANSETRON HCL 4 MG PO TABS: 4.0000 mg | ORAL_TABLET | ORAL | Status: DC | PRN

## 2019-01-16 NOTE — Anesthesia Procedure Notes (Signed)
Epidural Patient location during procedure: OB Start time: 01/16/2019 8:14 AM End time: 01/16/2019 8:21 AM  Staffing Anesthesiologist: Josephine Igo, MD Performed: anesthesiologist   Preanesthetic Checklist Completed: patient identified, site marked, surgical consent, pre-op evaluation, timeout performed, IV checked, risks and benefits discussed and monitors and equipment checked  Epidural Patient position: sitting Prep: site prepped and draped and DuraPrep Patient monitoring: continuous pulse ox and blood pressure Approach: midline Location: L3-L4 Injection technique: LOR air  Needle:  Needle type: Tuohy  Needle gauge: 17 G Needle length: 9 cm and 9 Needle insertion depth: 4 cm Catheter type: closed end flexible Catheter size: 19 Gauge Catheter at skin depth: 9 cm Test dose: negative and Other  Assessment Events: blood not aspirated, injection not painful, no injection resistance, negative IV test and no paresthesia  Additional Notes Patient identified. Risks and benefits discussed including failed block, incomplete  Pain control, post dural puncture headache, nerve damage, paralysis, blood pressure Changes, nausea, vomiting, reactions to medications-both toxic and allergic and post Partum back pain. All questions were answered. Patient expressed understanding and wished to proceed. Sterile technique was used throughout procedure. Epidural site was Dressed with sterile barrier dressing. No paresthesias, signs of intravascular injection Or signs of intrathecal spread were encountered.  Patient was more comfortable after the epidural was dosed. Please see RN's note for documentation of vital signs and FHR which are stable. Reason for block:procedure for pain

## 2019-01-16 NOTE — Progress Notes (Signed)
Delivery Note At 9:07 AM a viable female was delivered via  (Presentation:LOA ;  ).  APGAR: , ; weight  .   Placenta status:intact , .  Cord: 3 vessels with the following complications: .  Cord pH: pending  Anesthesia:   Episiotomy:   Lacerations:  intact Suture Repair: N/A Est. Blood Loss (mL):  <100 ml  Mom to postpartum.  Baby to Couplet care / Skin to Skin.  [name] II 01/16/2019, 9:16 AM

## 2019-01-16 NOTE — Lactation Note (Signed)
This note was copied from a baby's chart. Lactation Consultation Note  Patient Name: Denise Orr [id number] Date: 01/16/2019 Reason for consult: Term(per mom baby recently breastfed for 25 mins / aware to page for latch assessment) Baby is 33 hours old As LC entered the room ,baby " Jonelle Sidle " asleep STS on mom chest.  Per mom the baby recently fed at 4: 05 pm for 25 mins, swallows and comfortable feeding.  per mom 1st and 2nd baby  were in NICU - 1st was early and 2nd and baby had septis - they both transitioned to breastfeeding well and fed for 8 months and received breast milk after 8 months.  Mom expressed it feels great to have mom baby with me and having to all the pumping.  LC reviewed basics and answered many breastfeeding questions. Its been almost 3 years since her last baby and  It was a teachable moment.  LC recommended to feed with feeding cues and the breast feeding goal is to feed baby 8-12 times a day.  If its been awhile since a feeding and the baby isn't showing feeding cues to check the baby's diaper, change of needed  And place the baby STS.  LC stressed the importance of STS feedings until the baby is back to birth weight, gaining steadily and can stay awake for Majority of feeding.  Mom asked if when her milk comes in if the baby only feeds 1st breast and doesn't feed the 2nd breast should she pump.  LC recommended to release the 2nd breast down to comfort with hand expressing or using the hand pump. LC explained  When the baby is cluster feeding the baby will help her more on the 2nd breast / also when she goes through growth spurts.  LC instructed mom on the use of the hand pump for d/C.  Per mom has DEBP at home. LC provided the information on feeding cues, BFSG , and [place] phone line number, LC O/P.   Mom aware to call with feeding cues for latch assessment.     Maternal Data Does the patient have breastfeeding experience prior to this delivery?:  Yes  Feeding Feeding Type: (last fed at 1605)  LATCH Score                   Interventions Interventions: Breast feeding basics reviewed  Lactation Tools Discussed/Used Tools: Pump Breast pump type: Manual WIC Program: No Pump Review: Setup, frequency, and cleaning Initiated by:: MAI Date initiated:: 01/16/19   Consult Status Consult Status: Follow-up Date: 01/16/19 Follow-up type: In-patient    [facility] 01/16/2019, 5:28 PM

## 2019-01-16 NOTE — Anesthesia Postprocedure Evaluation (Signed)
Anesthesia Post Note  Patient: Denise Orr  Procedure(s) Performed: AN AD HOC LABOR EPIDURAL     Patient location during evaluation: Mother Baby Anesthesia Type: Epidural Level of consciousness: awake and alert Pain management: pain level controlled Vital Signs Assessment: post-procedure vital signs reviewed and stable Respiratory status: spontaneous breathing, nonlabored ventilation and respiratory function stable Cardiovascular status: stable Postop Assessment: no headache, no backache, epidural receding, no apparent nausea or vomiting, patient able to bend at knees, adequate PO intake and able to ambulate Anesthetic complications: no    Last Vitals:  Vitals:   01/16/19 1055 01/16/19 1200  BP: 117/67 104/66  Pulse: (!) 59 67  Resp: 18 18  Temp: 37.1 C 36.9 C  SpO2: 99% 99%    Last Pain:  Vitals:   01/16/19 1200  TempSrc: Oral  PainSc: 1    Pain Goal: Patients Stated Pain Goal: 4 (01/16/19 1200)                 [name]

## 2019-01-16 NOTE — Anesthesia Preprocedure Evaluation (Signed)
Anesthesia Evaluation  Patient identified by MRN, date of birth, ID band Patient awake    Reviewed: Allergy & Precautions, Patient's Chart, lab work & pertinent test results  Airway Mallampati: II  TM Distance: >3 FB Neck ROM: Full    Dental no notable dental hx. (+) Teeth Intact   Pulmonary neg pulmonary ROS,    Pulmonary exam normal breath sounds clear to auscultation       Cardiovascular negative cardio ROS Normal cardiovascular exam Rhythm:Regular Rate:Normal     Neuro/Psych negative neurological ROS  negative psych ROS   GI/Hepatic negative GI ROS, Neg liver ROS,   Endo/Other  Obesity  Renal/GU negative Renal ROS  negative genitourinary   Musculoskeletal negative musculoskeletal ROS (+)   Abdominal (+) + obese,   Peds  Hematology  (+) anemia ,   Anesthesia Other Findings   Reproductive/Obstetrics (+) Pregnancy                             Anesthesia Physical Anesthesia Plan  ASA: II  Anesthesia Plan: Epidural   Post-op Pain Management:    Induction:   PONV Risk Score and Plan:   Airway Management Planned: Natural Airway  Additional Equipment:   Intra-op Plan:   Post-operative Plan:   Informed Consent: I have reviewed the patients History and Physical, chart, labs and discussed the procedure including the risks, benefits and alternatives for the proposed anesthesia with the patient or authorized representative who has indicated his/her understanding and acceptance.       Plan Discussed with: Anesthesiologist  Anesthesia Plan Comments:         Anesthesia Quick Evaluation  

## 2019-01-16 NOTE — Progress Notes (Signed)
C/C/+2/vtx FHT variable decels with UC

## 2019-01-17 ENCOUNTER — Encounter (HOSPITAL_COMMUNITY): Payer: Self-pay | Admitting: *Deleted

## 2019-01-17 LAB — CBC
HCT: 33.3 % — ABNORMAL LOW (ref 36.0–46.0)
Hemoglobin: 10.8 g/dL — ABNORMAL LOW (ref 12.0–15.0)
MCH: 28.6 pg (ref 26.0–34.0)
MCHC: 32.4 g/dL (ref 30.0–36.0)
MCV: 88.3 fL (ref 80.0–100.0)
Platelets: 302 10*3/uL (ref 150–400)
RBC: 3.77 MIL/uL — ABNORMAL LOW (ref 3.87–5.11)
RDW: 13 % (ref 11.5–15.5)
WBC: 12.9 10*3/uL — ABNORMAL HIGH (ref 4.0–10.5)
nRBC: 0 % (ref 0.0–0.2)

## 2019-01-17 LAB — RPR: RPR Ser Ql: NONREACTIVE

## 2019-01-17 MED ORDER — IBUPROFEN 600 MG PO TABS: 600.0000 mg | ORAL_TABLET | Freq: Four times a day (QID) | ORAL | 0 refills | Status: DC

## 2019-01-17 NOTE — Lactation Note (Signed)
This note was copied from a baby's chart. Lactation Consultation Note  Patient Name: Girl Amika Tassin [id number] Date: 01/17/2019 Reason for consult: Follow-up assessment;Term  Reviewed basic with Mom and FOB.  Baby at 61 hrs old and at 5% weight loss.  Mom reports deep latches that are comfortable and states she is feeling uterine cramping during feedings.  Baby is latching and feeding often.  Reviewed importance of keeping baby STS, and offering breast with any cue.  Engorgement prevention and treatment reviewed.  OP lactation support reviewed.  To call prn.  Consult Status Consult Status: Complete Date: 01/17/19 Follow-up type: Call as needed    Broadus John 01/17/2019, 11:17 AM

## 2019-01-17 NOTE — Discharge Summary (Signed)
Obstetric Discharge Summary Reason for Admission: induction of labor Prenatal Procedures: none Intrapartum Procedures: spontaneous vaginal delivery Postpartum Procedures: none Complications-Operative and Postpartum: none Hemoglobin  Date Value Ref Range Status  01/17/2019 10.8 (L) 12.0 - 15.0 g/dL Final   HCT  Date Value Ref Range Status  01/17/2019 33.3 (L) 36.0 - 46.0 % Final    Physical Exam:  General: alert, cooperative and appears stated age 33: appropriate Uterine Fundus: firm Incision: healing well, no significant drainage, no dehiscence DVT Evaluation: No evidence of DVT seen on physical exam. Negative Homan's sign. No cords or calf tenderness.  Discharge Diagnoses: Term Pregnancy-delivered  Discharge Information: Date: 01/17/2019 Activity: pelvic rest Diet: routine Medications: PNV and Ibuprofen Condition: stable Instructions: refer to practice specific booklet Discharge to: home   Newborn Data: Live born female  Birth Weight: 6 lb 11.1 oz (3035 g) APGAR: 53, 9  Newborn Delivery   Birth date/time: 01/16/2019 09:07:00 Delivery type: Vaginal, Spontaneous      Home with mother.  Linda Hedges 01/17/2019, 8:56 AM

## 2019-01-17 NOTE — Progress Notes (Signed)
Post Partum Day 1 Subjective: no complaints, up ad lib, voiding and tolerating PO  Objective: Blood pressure 104/73, pulse 67, temperature 98.3 F (36.8 C), temperature source Oral, resp. rate 20, height 5\' 2"  (1.575 m), weight 77.1 kg, last menstrual period 04/06/2018, SpO2 100 %.  Physical Exam:  General: alert, cooperative and appears stated age Lochia: appropriate Uterine Fundus: firm Incision: healing well, no significant drainage, no dehiscence DVT Evaluation: No evidence of DVT seen on physical exam. Negative Homan's sign. No cords or calf tenderness.  Recent Labs    01/16/19 1226 01/17/19 0529  HGB 11.1* 10.8*  HCT 33.0* 33.3*    Assessment/Plan: Plan for discharge tomorrow, Discharge home and Breastfeeding   LOS: 2 days   Linda Hedges 01/17/2019, 8:50 AM

## 2019-01-17 NOTE — Discharge Instructions (Signed)
Call MD for T>100.4, heavy vaginal bleeding, severe abdominal pain, or respiratory distress.  Call office to schedule postpartum visit in 6 weeks.  Pelvic rest x 6 weeks.   

## 2020-05-09 IMAGING — US US MFM MCA DOPPLER
1 series · 15 of 28 positions shown · non-contrast
Comparison: none

[Series 1: us mfm mca doppler · 15 of 46 slices shown]
[im 1/46]
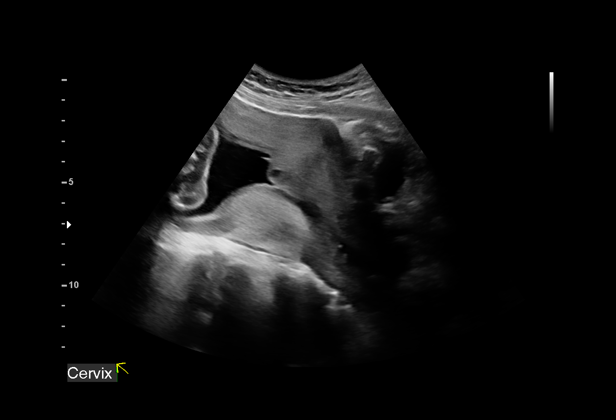
[im 4/46]
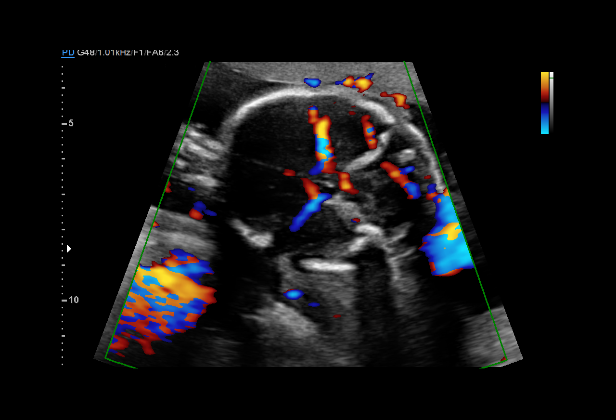
[im 7/46]
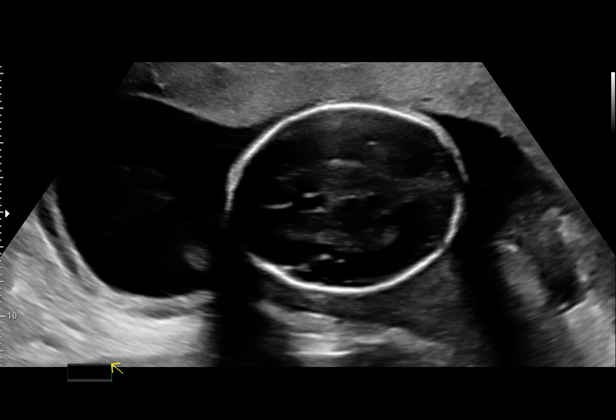
[im 11/46]
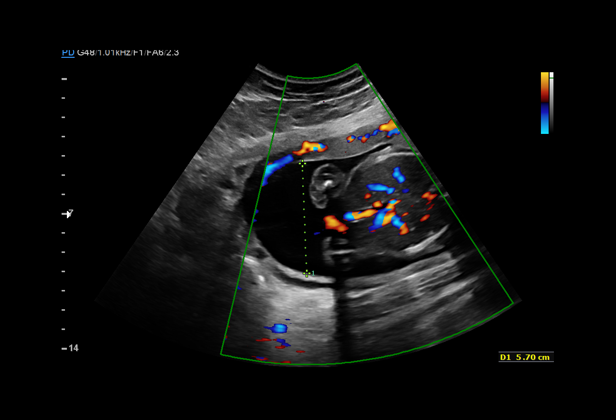
[im 14/46]
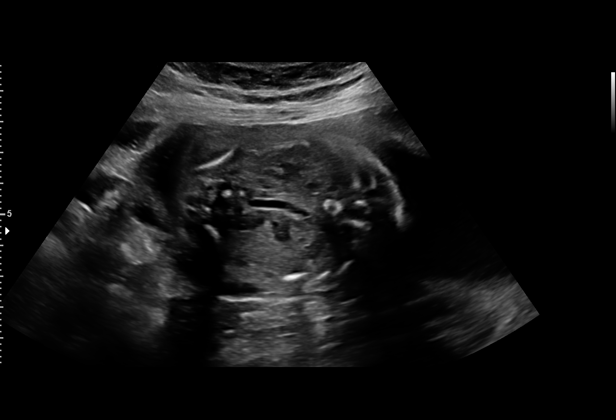
[im 17/46]
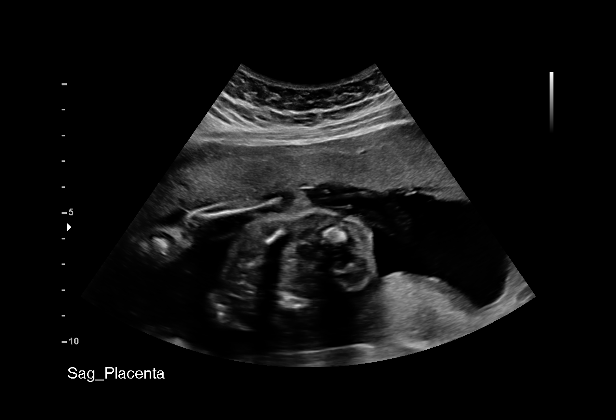
[im 21/46]
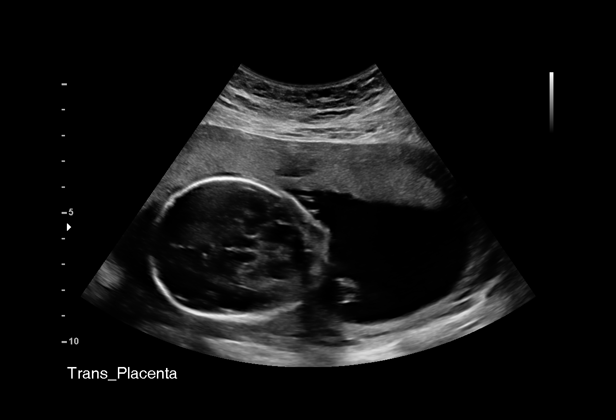
[im 24/46]
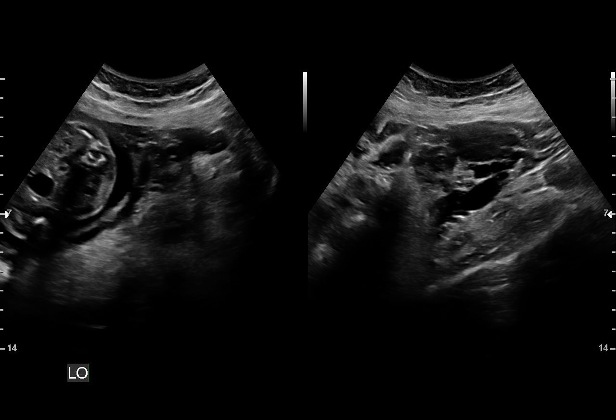
[im 26/46]
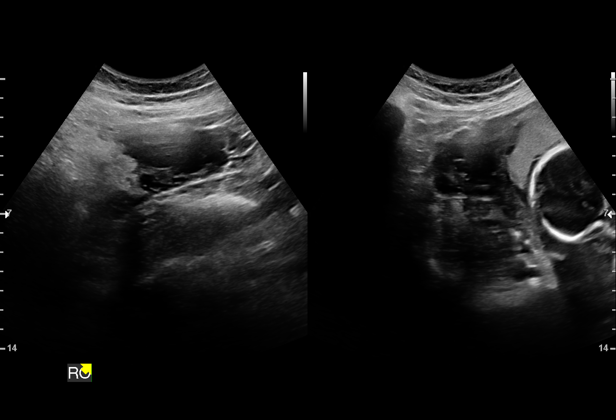
[im 29/46]
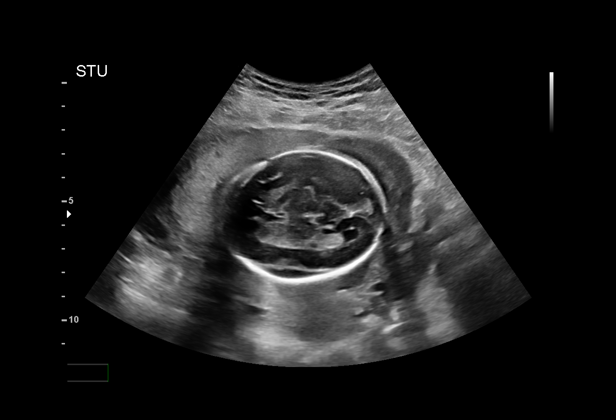
[im 32/46]
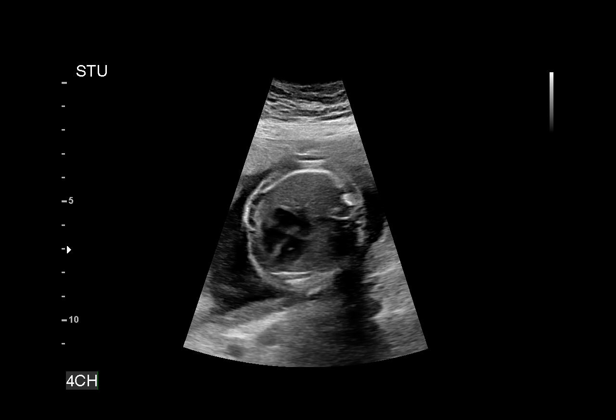
[im 36/46]
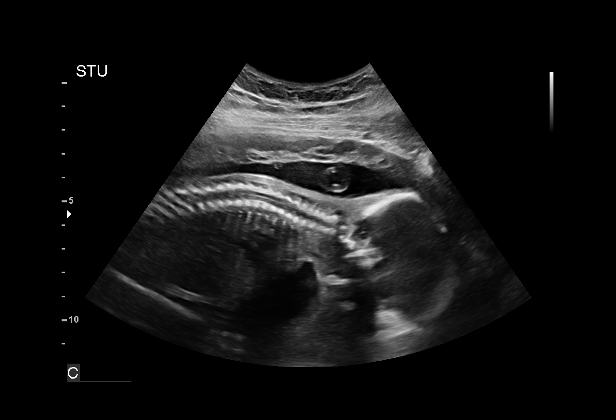
[im 39/46]
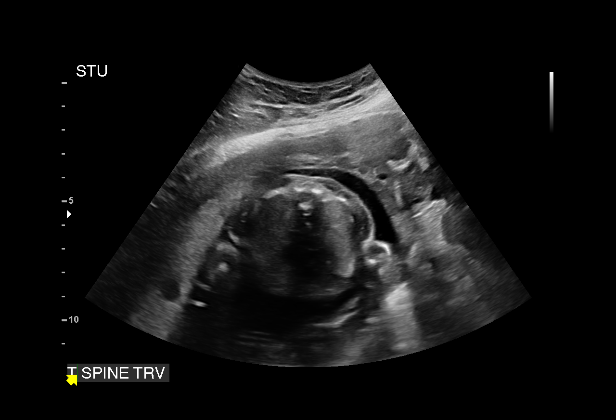
[im 42/46]
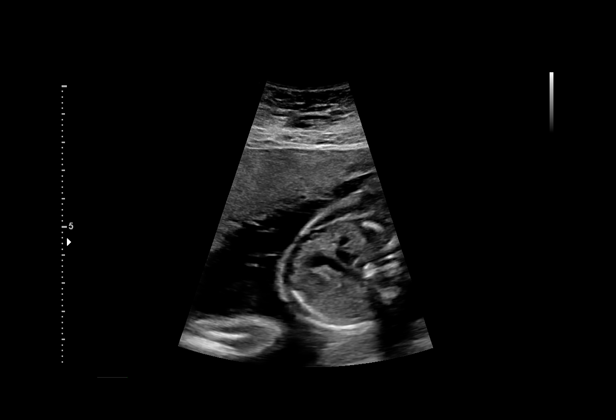
[im 46/46]
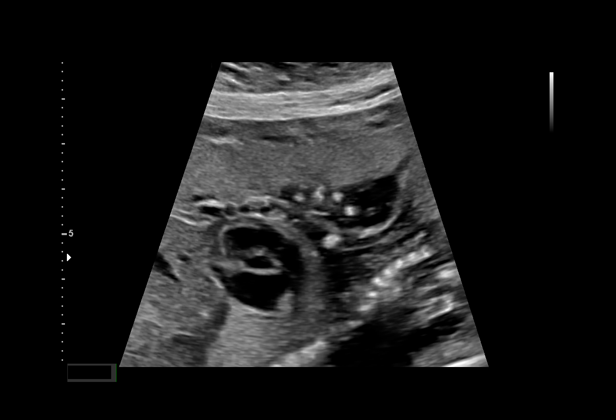

[15 of 28 positions shown; findings below may reference images not displayed]

----------------------------------------------------------------------

 ----------------------------------------------------------------------
Indications

  21 weeks gestation of pregnancy
  Maternal viral disease, antepartum,
  Parvovirus
 ----------------------------------------------------------------------
Fetal Evaluation

 Num Of Fetuses:         1
 Fetal Heart Rate(bpm):  137
 Cardiac Activity:       Observed
 Presentation:           Cephalic
 Placenta:               Anterior

 Amniotic Fluid
 AFI FV:      Within normal limits

                             Largest Pocket(cm)

OB History

 Gravidity:    3         Term:   1        Prem:   1        SAB:   0
 TOP:          0       Ectopic:  0        Living: 2
Gestational Age

 LMP:           23w 0d        Date:  04/06/18                 EDD:   01/11/19
 Best:          21w 5d     Det. By:  Early Ultrasound         EDD:   01/20/19
                                     (05/28/18)
Doppler - Fetal Vessels

 Middle Cerebral Artery
                                                    PSV    MoM
                                                  (cm/s)
                                                      32

Cervix Uterus Adnexa

 Uterus
 No abnormality visualized.

 Left Ovary
 Within normal limits.

 Right Ovary
 Within normal limits.

 Cul De Sac
 No free fluid seen.

 Adnexa
 No abnormality visualized.
Impression

 A limited ultrasound study was performed. Amniotic fluid is
 normal and good fetal activity is seen. No evidence of fetal
 hydrops. Middle-cerebral artery (MCA) Doppler showed
 normal peak-systolic velocity measurements (1.17 MoM). No
 evidence of fetal anemia. On transabdominal scan, the cervix
 looks normal.
 xxxxxxxxxxxxxxxxxxxxxxxxxxxxxxxxxxxxxxxxxxxxxxxxxxxxxxxxx
 xx
 Consultation Note (copied from [REDACTED])
 Maternal-Fetal Medicine
 Name: Paulus N Ceejay
 MRN: [id number]
 Requesting Provider: Hello, Yuri
 ETTAYEB had the pleasure of seeing [facility] She was accompanied by her
 husband. As you know, she is G3 P2 at 21-weeks' gestation
 with recent Parvovirus B19 infection.
 Her first son had fifth disease and had rashes about 2 to 3
 weeks ago. She had "sore joints" and later rashes on her
 arms and legs. She did not have fever. Blood drawn on
 09/05/2018 showed increased IgG (3.4) and increased IgM
 (11.6) levels.
 Prenatal course has otherwise been uneventful. On cell-free
 fetal DNA screening, the risks of fetal aneuploidies are not
 increased. On fetal anatomy scan performed at your office,
 an echogenic intracardiac focus was seen. Patient is taking
 weekly 17-OH progesterone prophylactic injections.
 PMH: No history of hypertension or diabetes or any other
 chronic medical conditions.
 PSH: Tonsillectomy in childhood.
 Medications: Prenatal vitamins, Gouki.
 Allergies: Bactrim (rashes)
 Social: Denies tobacco or drug or alcohol use. She is married
 and her husband (father of all her children) is in good health.
 Family: No history of venous thromboembolism in the family.
 Obstetric history:
 -[DATE]: PPROM and vaginal delivery at 34 weeks'
 gestation of a male infant weighing 4+ lbs. He is in good
 health.
 -[DATE]: Term vaginal delivery of a male infant weighing 6
 lbs at birth. He had GBS pneumonia (early-onset GBS
 infection). He is in good health now. Patient delivered in
 [REDACTED].
 Gyn: No history of abnormal Pap smears or cervical surgeries.
 Ultrasound:
 A limited ultrasound study was performed. Amniotic fluid is
 normal and good fetal activity is seen. No evidence of fetal
 hydrops. Middle-cerebral artery (MCA) Doppler showed
 normal peak-systolic velocity measurements (1.17 MoM). No
 evidence of fetal anemia. On transabdominal scan, the cervix
 looks normal.
 I counseled the couple on the following:
 Parvovirus B19 infection in pregnancy:
 Patient has/had recent Parvo infection. Following are strong
 indicators: 1) increased IgG and IgM antibodies, 2) patient
 had symptoms of arthralgia and rashes, 3) her son had parvo
 infection ("fifth disease) 2 to 3 weeks ago.
 -Infection is acquired/transmitted before the clinical
 symptoms of rashes.
 -Increased IgG levels indicate infection from 7 days to 6
 months. IgM increase first in about 3 weeks after exposure to
 virus followed by IgG (3 to 4 weeks after exposure).
 -Infection occurs by respiratory route, contact and from
 mother to the fetus.
 -About 30% to 50% of pregnant women who acquire the virus
 have transplacental transmission.
 -About one-third of pregnant women are nonimmune to
 parvovirus.
 -Only a small number of fetuses are infected. If infection is
 acquired in the first 20 weeks, the fetal loss is higher (6% to
 10%); if infection occurs after 20 weeks, the fetal loss is very
 low (less than 1%).
 -Fetal infection can cause red cell destruction and bone
 marrow suppression leading to fetal anemia.
 -Fetal anemia can be diagnosed by noninvasive method of
 middle-cerebral artery Doppler study.
 -We recommend weekly ultrasound to perform MCA Doppler
 studies for a total of 12 weeks to rule out fetal anemia. About
 90% of fetal anemia are evident by 8 weeks after exposure.
 Since the patient must have acquired infection about 3 weeks
 ago, it is reasonable to perform MCA Doppler for 8 to 9
 weeks from now.
 -I discussed management of fetal anemia that requires
 confirmation by fetal blood sampling and, if anemia is
 confirmed fetal blood transfusion is indicated. Usually, one
 transfusion is enough and we would expect survival in more
 than 80% of cases.
 -Some cases of fetal anemia resolve spontaneously.
 -Patient will have lifelong immunity after this infection.

 History of preterm delivery:
 Patient had one preterm delivery and took vaginal
 progesterone in her previous pregnancy and delivered at
 term. I counseled her that history of preterm delivery
 increases the risk of recurrent preterm delivery. I discussed
 the benefit of 17-OH progesterone (Sisay) that is likely to
 reduce the risk of recurrent preterm delivery.
 I reassured the finding of normal cervical length on
 transabdominal scan.
 Previous infant with GBS pneumonia:
 Patient informed that her second son (term delivery) had
 GBS pneumonia. She also reported (or unsure) that she
 screened negative for GBS during pregnancy. If neonatal
 records are not available from the previous hospital, I
 recommend penicillin prophylaxis during labor in this
 pregnancy.

 Echogenic intracardiac focus:
 I counseled the patient that echogenic focus is seen in about
 3% to 4% of normal fetuses (more in Asian population), and
 in about 15%-20% of fetuses with Down syndrome. She was
 reassured that echogenic focus is not associated with any
 structural heart malformations. Presence of this isolated
 marker only slightly increases the a priori risk for Down
 syndrome.

 I informed the patient that given that she had low risk for fetal
 aneuploidies on cell-free fetal DNA screening, finding of
 echogenic intracardiac focus should be considered a normal
 variant and that the risk of trisomy 21 is not increased. I also
 reassured that echogenic focus does not increase the risk of
 cardiac defects. I informed the patient that only
 amniocentesis will give a definitive result on the fetal
 karyotype and the procedure-related loss is about 1 in 500 or
 less.
Recommendations

 -Weekly MCA Doppler studies.
 -Detailed fetal anatomy and cervical length measurement
 next week.
 -If there is no evidence of anemia after 9 weeks' surveillance,
 follow-up scans are not necessary.
 -Intrapartum penicillin G prophylaxis regardless of GBS
 screening result.
 -Continue 17-OH progesterone prophylaxis till 36 weeks.
                 Gouki

## 2022-04-22 ENCOUNTER — Encounter (HOSPITAL_BASED_OUTPATIENT_CLINIC_OR_DEPARTMENT_OTHER): Payer: BLUE CROSS/BLUE SHIELD | Admitting: Medical

## 2022-06-21 ENCOUNTER — Encounter (HOSPITAL_BASED_OUTPATIENT_CLINIC_OR_DEPARTMENT_OTHER): Payer: Self-pay | Admitting: Advanced Practice Midwife

## 2022-06-21 ENCOUNTER — Other Ambulatory Visit (HOSPITAL_COMMUNITY)
Admission: RE | Admit: 2022-06-21 | Discharge: 2022-06-21 | Disposition: A | Discharge status: Home / Self Care | Payer: BC Managed Care – PPO | Source: Ambulatory Visit | Attending: Advanced Practice Midwife | Admitting: Advanced Practice Midwife

## 2022-06-21 ENCOUNTER — Ambulatory Visit (HOSPITAL_BASED_OUTPATIENT_CLINIC_OR_DEPARTMENT_OTHER): Payer: BC Managed Care – PPO | Admitting: Advanced Practice Midwife

## 2022-06-21 VITALS — BP 111/69 | HR 81 | Ht 61.0 in | Wt 142.0 lb

## 2022-06-21 DIAGNOSIS — R6882 Decreased libido: Secondary | ICD-10-CM | POA: Diagnosis not present

## 2022-06-21 DIAGNOSIS — Z01419 Encounter for gynecological examination (general) (routine) without abnormal findings: Secondary | ICD-10-CM | POA: Diagnosis present

## 2022-06-21 DIAGNOSIS — Z30430 Encounter for insertion of intrauterine contraceptive device: Secondary | ICD-10-CM | POA: Insufficient documentation

## 2022-06-21 MED ORDER — LEVONORGESTREL 20 MCG/DAY IU IUD
1.0000 | INTRAUTERINE_SYSTEM | Freq: Once | INTRAUTERINE | Status: AC
  Administered 2022-06-21: 1 via INTRAUTERINE

## 2022-06-21 NOTE — Progress Notes (Signed)
Subjective:     Denise Orr is a 36 y.o. female here at [facility] for a routine exam.  Current complaints: decreased sex drive, recently stopped OCPs to see if that would help but no improvement yet.  Does not desire another pregnancy and her husband is planning a vasectomy but has not scheduled it yet.   Personal health questionnaire reviewed: yes.  Do you have a primary care provider? yes Do you feel safe at home? yes  Flowsheet Row Office Visit from 06/21/2022 in [facility]  PHQ-2 Total Score 0       Health Maintenance Due  Topic Date Due   Hepatitis C Screening  Never done   PAP SMEAR-Modifier  Never done   INFLUENZA VACCINE  Never done   COVID-19 Vaccine (4 - 2023-24 season) 03/25/2022     Risk factors for chronic health problems: Smoking: never Alchohol/how much: none Pt BMI: Body mass index is 26.83 kg/m.   Gynecologic History Patient's last menstrual period was 06/06/2022 (exact date). Contraception: none Last Pap: 2020 or 2021. Results were: normal per pt Last mammogram: n/a. Results were: normal  Obstetric History OB History  Gravida Para Term Preterm AB Living  3 3 2 1   2   SAB IAB Ectopic Multiple Live Births               # Outcome Date GA Lbr Len/2nd Weight Sex Delivery Anes PTL Lv  3 Term 01/16/19 [redacted]w[redacted]d   F Vag-Spont     2 Term 2016          1 Preterm 2014 [redacted]w[redacted]d            The following portions of the patient's history were reviewed and updated as appropriate: allergies, current medications, past family history, past medical history, past social history, past surgical history, and problem list.  Review of Systems Pertinent items noted in HPI and remainder of comprehensive ROS otherwise negative.    Objective:   VS reviewed, nursing note reviewed,  Constitutional: well developed, well nourished, no distress HEENT: normocephalic, thyroid without enlargement or mass HEART: RRR, no murmurs rubs/gallops RESP: clear and  equal to auscultation bilaterally in all lobes  Breast Exam: exam performed: right breast normal without mass, skin or nipple changes or axillary nodes, left breast normal without mass, skin or nipple changes or axillary nodes Abdomen: soft Neuro: alert and oriented x 3 Skin: warm, dry Psych: affect normal Pelvic exam:  Performed: Cervix pink, visually closed, without lesion, scant white creamy discharge, vaginal walls and external genitalia normal Bimanual exam: Cervix 0/long/high, firm, anterior, neg CMT, uterus nontender, nonenlarged, adnexa without tenderness, enlargement, or mass     IUD Procedure Note Patient identified, informed consent performed.  Discussed risks of irregular bleeding, cramping, infection, malpositioning or misplacement of the IUD outside the uterus which may require further procedures. Time out was performed.  Urine pregnancy test negative.  Speculum placed in the vagina.  Cervix visualized.  Cleaned with Betadine x 2.  Grasped anteriorly with a single tooth tenaculum.  Uterus sounded to 8 cm.  Mirena IUD placed per manufacturer's recommendations.  Strings trimmed to 3 cm. Tenaculum was removed, good hemostasis noted.  Patient tolerated procedure well.   Patient was given post-procedure instructions and the Mirena care card with expiration date.  Patient was also asked to check IUD strings periodically and follow up in 4-6 weeks for IUD check.   Assessment/Plan:   1. Encounter for gynecological examination with Papanicolaou smear  of cervix  - Cytology - PAP( [facility])  2. Encounter for insertion of mirena IUD --Discussed pt contraceptive plans and reviewed contraceptive methods based on pt preferences and effectiveness.  Pt prefers to try IUD and desires to have light/no menses.  - levonorgestrel (MIRENA) 20 MCG/DAY IUD 1 each  3. Well woman exam with routine gynecological exam   4. Low libido --Not improved after stopping OCPs, now risk of pregnancy may be  adding to lack of interest. --IUD inserted today, f/u about libido at string check or additional visit in 3-4 months.      Return in about 4 weeks (around 07/19/2022) for string check.   Sharen Counter, CNM 2:40 PM

## 2022-06-22 LAB — CYTOLOGY - PAP
Comment: NEGATIVE
Diagnosis: NEGATIVE
High risk HPV: NEGATIVE

## 2022-07-12 ENCOUNTER — Telehealth: Payer: BC Managed Care – PPO | Admitting: Urgent Care

## 2022-07-12 DIAGNOSIS — U071 COVID-19: Secondary | ICD-10-CM

## 2022-07-12 MED ORDER — NIRMATRELVIR/RITONAVIR (PAXLOVID)TABLET: 3.0000 | ORAL_TABLET | Freq: Two times a day (BID) | ORAL | 0 refills | Status: AC

## 2022-07-12 NOTE — Patient Instructions (Signed)
  Sofie Hartigan, thank you for joining [name], PA for today's virtual visit.  While this provider is not your primary care provider (PCP), if your PCP is located in our provider database this encounter information will be shared with them immediately following your visit.   A [facility] MyChart account gives you access to today's visit and all your visits, tests, and labs performed at [facility] " click here if you don't have a [facility] MyChart account or go to [email or web address]  Consent: (Patient) Denise Orr provided verbal consent for this virtual visit at the beginning of the encounter.  Current Medications:  Current Outpatient Medications:    nirmatrelvir/ritonavir EUA (PAXLOVID) 20 x 150 MG & 10 x 100MG  TABS, Take 3 tablets by mouth 2 (two) times daily for 5 days. (Take nirmatrelvir 150 mg two tablets twice daily for 5 days and ritonavir 100 mg one tablet twice daily for 5 days) Patient GFR is 117, Disp: 30 tablet, Rfl: 0   Medications ordered in this encounter:  Meds ordered this encounter  Medications   nirmatrelvir/ritonavir EUA (PAXLOVID) 20 x 150 MG & 10 x 100MG  TABS    Sig: Take 3 tablets by mouth 2 (two) times daily for 5 days. (Take nirmatrelvir 150 mg two tablets twice daily for 5 days and ritonavir 100 mg one tablet twice daily for 5 days) Patient GFR is 117    Dispense:  30 tablet    Refill:  0    Order Specific Question:   Supervising Provider    Answer:        *If you need refills on other medications prior to your next appointment, please contact your pharmacy*  Follow-Up: Call back or seek an in-person evaluation if the symptoms worsen or if the condition fails to improve as anticipated.  [facility] [phone]  Other Instructions You are positive for covid. Supportive measures with rest and hydration may be adequate. Try OTC medications to help with your nasal congestion and  headache. Some people have tried OTC Quercetin to help fight off illness.  If you develop severe symptoms, you may start taking paxlovid. Please read the attached instruction sheet. Side effects include headache and diarrhea.  This medication must be started within 5 days of symptom onset.  If any new or worsening symptoms develops, particularly uncontrollable fever, severe shortness of breath or chest pain, please head to the ER.    If you have been instructed to have an in-person evaluation today at a local Urgent Care facility, please use the link below. It will take you to a list of all of our available [facility], including address, phone number and hours of operation. Please do not delay care.  [facility]  If you or a family member do not have a primary care provider, use the link below to schedule a visit and establish care. When you choose a [facility] primary care physician or advanced practice provider, you gain a long-term partner in health. Find a Primary Care Provider  Learn more about [facility] in-office and virtual care options: [facility] - Get Care Now

## 2022-07-12 NOTE — Progress Notes (Signed)
Virtual Visit Consent   Denise Orr, you are scheduled for a virtual visit with a [facility] provider today. Just as with appointments in the office, your consent must be obtained to participate. Your consent will be active for this visit and any virtual visit you may have with one of our providers in the next 365 days. If you have a MyChart account, a copy of this consent can be sent to you electronically.  As this is a virtual visit, video technology does not allow for your provider to perform a traditional examination. This may limit your provider's ability to fully assess your condition. If your provider identifies any concerns that need to be evaluated in person or the need to arrange testing (such as labs, EKG, etc.), we will make arrangements to do so. Although advances in technology are sophisticated, we cannot ensure that it will always work on either your end or our end. If the connection with a video visit is poor, the visit may have to be switched to a telephone visit. With either a video or telephone visit, we are not always able to ensure that we have a secure connection.  By engaging in this virtual visit, you consent to the provision of healthcare and authorize for your insurance to be billed (if applicable) for the services provided during this visit. Depending on your insurance coverage, you may receive a charge related to this service.  I need to obtain your verbal consent now. Are you willing to proceed with your visit today? Denise Orr has provided verbal consent on 07/12/2022 for a virtual visit (video or telephone). [name], PA  Date: 07/12/2022 6:51 PM  Virtual Visit via Video Note   I, Denise Orr, connected with  Denise Orr  ([id number], July 09, 1986) on 07/12/22 at  6:45 PM EST by a video-enabled telemedicine application and verified that I am speaking with the correct person using two identifiers.  Location: Patient: Virtual Visit Location Patient:  Home Provider: Virtual Visit Location Provider: Home Office   I discussed the limitations of evaluation and management by telemedicine and the availability of in person appointments. The patient expressed understanding and agreed to proceed.    History of Present Illness: Denise Orr is a 36 y.o. female is being seen today for positive home covid test.  HPI: 36yo female presents today to discuss paxlovid. She states last evening she started having a severe head cold. States this morning it was worse so took two home covid tests, both of which were positive. Pt has a hx of HLD but is on no medications for this. She denies cough, SOB, fever, CP, palpitations. She had covid 2 years ago, denies complications from it. Wants to try antiviral therapy to prevent getting others sick around the holidays.    Problems:  Patient Active Problem List   Diagnosis Date Noted   Encounter for insertion of mirena IUD 06/21/2022   Low libido 06/21/2022    Allergies:  Allergies  Allergen Reactions   Bactrim [Sulfamethoxazole-Trimethoprim]     Rash-hives   Medications:  Current Outpatient Medications:    nirmatrelvir/ritonavir EUA (PAXLOVID) 20 x 150 MG & 10 x 100MG  TABS, Take 3 tablets by mouth 2 (two) times daily for 5 days. (Take nirmatrelvir 150 mg two tablets twice daily for 5 days and ritonavir 100 mg one tablet twice daily for 5 days) Patient GFR is 117, Disp: 30 tablet, Rfl: 0  Observations/Objective: Patient is well-developed, well-nourished in no acute distress.  Resting  comfortably at home.  Head is normocephalic, atraumatic.  No labored breathing. No cough on exam. Speech is clear and coherent with logical content.  Patient is alert and oriented at baseline.  Mask in place.  Assessment and Plan: 1. COVID-19  Pt 24 hours into covid symptoms, appear mild at present time. Is concerned it will get much worse during the holidays. Pt does have HLD, not currently being treated. We discussed  risks, benefits, uses and side effects to the medication. Called in, but encouraged pt to monitor over the next 24 hours. If no worsening sx, do not take and tx sx only. If severely worsening, must start within 5 days.   Follow Up Instructions: I discussed the assessment and treatment plan with the patient. The patient was provided an opportunity to ask questions and all were answered. The patient agreed with the plan and demonstrated an understanding of the instructions.  A copy of instructions were sent to the patient via MyChart unless otherwise noted below.   The patient was advised to call back or seek an in-person evaluation if the symptoms worsen or if the condition fails to improve as anticipated.  Time:  I spent 7 minutes with the patient via telehealth technology discussing the above problems/concerns.    [name] L [name], PA

## 2022-08-02 ENCOUNTER — Ambulatory Visit (HOSPITAL_BASED_OUTPATIENT_CLINIC_OR_DEPARTMENT_OTHER): Payer: BC Managed Care – PPO | Admitting: Advanced Practice Midwife

## 2022-08-30 ENCOUNTER — Ambulatory Visit (HOSPITAL_BASED_OUTPATIENT_CLINIC_OR_DEPARTMENT_OTHER): Payer: BC Managed Care – PPO | Admitting: Advanced Practice Midwife

## 2022-08-31 ENCOUNTER — Encounter (HOSPITAL_BASED_OUTPATIENT_CLINIC_OR_DEPARTMENT_OTHER): Payer: Self-pay | Admitting: Obstetrics & Gynecology

## 2022-08-31 ENCOUNTER — Ambulatory Visit (HOSPITAL_BASED_OUTPATIENT_CLINIC_OR_DEPARTMENT_OTHER): Payer: BC Managed Care – PPO | Admitting: Obstetrics & Gynecology

## 2022-08-31 VITALS — BP 109/71 | HR 61 | Ht 61.0 in | Wt 147.2 lb

## 2022-08-31 DIAGNOSIS — Z975 Presence of (intrauterine) contraceptive device: Secondary | ICD-10-CM

## 2022-08-31 DIAGNOSIS — Z30431 Encounter for routine checking of intrauterine contraceptive device: Secondary | ICD-10-CM | POA: Diagnosis not present

## 2022-08-31 NOTE — Progress Notes (Signed)
37 y.o. G33P2102 Married female presents for followed up after insertion of Mirena on 06/21/2022 by Fatima Blank.  Pt reports she is doing well.  She reports cycles are lighter.  No pain with intercourse.  Marland Kitchen   LMP:  Patient's last menstrual period was 07/31/2022 (approximate).  Patient Active Problem List   Diagnosis Date Noted   Encounter for insertion of mirena IUD 06/21/2022   Low libido 06/21/2022   Past Medical History:  Diagnosis Date   Medical history non-contributory    No current outpatient medications on file prior to visit.   No current facility-administered medications on file prior to visit.   Bactrim [sulfamethoxazole-trimethoprim]  Review of Systems  Constitutional: Negative.   Genitourinary: Negative.    Vitals:   08/31/22 1418  BP: 109/71  Pulse: 61  Weight: 147 lb 3.2 oz (66.8 kg)  Height: 5' 1"$  (1.549 m)    Gen:  WNWF healthy female NAD Abdomen: soft, non-tender Groin:  no inguinal nodes palpated  Pelvic exam: Vulva:  normal female genitalia Vagina:  normal vagina Cervix:  Non-tender, Negative CMT, no lesions or redness.  IUD string 3cm in length.   Uterus:  normal shape, position and consistency   Assessment/Plan: 1. IUD check up - normal exam - Follow-up for AEX. - Pt knows to call with any new concerns or problems.

## 2022-09-02 DIAGNOSIS — Z975 Presence of (intrauterine) contraceptive device: Secondary | ICD-10-CM | POA: Insufficient documentation

## 2022-09-12 ENCOUNTER — Encounter: Payer: Self-pay | Admitting: *Deleted

## 2023-01-31 ENCOUNTER — Ambulatory Visit (HOSPITAL_BASED_OUTPATIENT_CLINIC_OR_DEPARTMENT_OTHER): Payer: BC Managed Care – PPO | Admitting: Advanced Practice Midwife

## 2023-01-31 ENCOUNTER — Encounter (HOSPITAL_BASED_OUTPATIENT_CLINIC_OR_DEPARTMENT_OTHER): Payer: Self-pay | Admitting: Advanced Practice Midwife

## 2023-01-31 VITALS — BP 117/78 | HR 74 | Ht 62.0 in | Wt 145.0 lb

## 2023-01-31 DIAGNOSIS — Z30432 Encounter for removal of intrauterine contraceptive device: Secondary | ICD-10-CM

## 2023-01-31 MED ORDER — NORGESTIM-ETH ESTRAD TRIPHASIC 0.18/0.215/0.25 MG-25 MCG PO TABS: 1.0000 | ORAL_TABLET | Freq: Every day | ORAL | 11 refills | Status: DC

## 2023-01-31 NOTE — Progress Notes (Signed)
    GYNECOLOGY CLINIC PROCEDURE NOTE  Ms. Denise Orr is a 37 y.o. [phone] here for Mirena IUD removal. No GYN concerns.  Last pap smear was on 06/21/22 and was normal.  IUD Removal  Patient was in the dorsal lithotomy position, normal external genitalia was noted.  A speculum was placed in the patient's vagina, normal discharge was noted, no lesions. The multiparous cervix was visualized, no lesions, no abnormal discharge.  The strings of the IUD were grasped and pulled using ring forceps. The IUD was removed in its entirety.  Patient tolerated the procedure well.    Patient will use low dose OCPs and husband plans a vasectomy for contraception/ Routine preventative health maintenance measures emphasized.  Rx for Tri Lo Sprintec sent to pharmacy.   Sharen Counter, CNM 11:27 AM

## 2024-07-09 ENCOUNTER — Encounter (HOSPITAL_BASED_OUTPATIENT_CLINIC_OR_DEPARTMENT_OTHER): Payer: Self-pay | Admitting: Obstetrics and Gynecology

## 2024-07-09 ENCOUNTER — Ambulatory Visit (HOSPITAL_BASED_OUTPATIENT_CLINIC_OR_DEPARTMENT_OTHER): Payer: Self-pay | Admitting: Obstetrics and Gynecology

## 2024-07-09 VITALS — BP 122/73 | HR 78 | Ht 61.5 in | Wt 135.2 lb

## 2024-07-09 DIAGNOSIS — Z1331 Encounter for screening for depression: Secondary | ICD-10-CM

## 2024-07-09 DIAGNOSIS — N926 Irregular menstruation, unspecified: Secondary | ICD-10-CM

## 2024-07-09 DIAGNOSIS — Z01419 Encounter for gynecological examination (general) (routine) without abnormal findings: Secondary | ICD-10-CM | POA: Diagnosis not present

## 2024-07-09 DIAGNOSIS — Z30011 Encounter for initial prescription of contraceptive pills: Secondary | ICD-10-CM

## 2024-07-09 MED ORDER — LO LOESTRIN FE 1 MG-10 MCG / 10 MCG PO TABS: 1.0000 | ORAL_TABLET | Freq: Every day | ORAL | 3 refills | Status: AC

## 2024-07-09 NOTE — Progress Notes (Signed)
 ANNUAL EXAM Patient name: Denise Orr MRN [id number]  Date of birth: 02-17-1986 Chief Complaint:   Annual Exam  History of Present Illness:   Denise Orr is a 38 y.o. [phone]  female being seen today for a routine annual exam.  Current complaints: reports she has been having a period every three weeks, also noticing changes to in mood around cycle. She is able to fall asleep but waking up at 2-3 Her husband has had a vasectomy   Patient's last menstrual period was 07/02/2024 (approximate).   Gynecologic History Contraception: vasectomy Last Pap:06/21/22 . Results were: NILM Last mammogram: 06/13/24 Normal  Colonoscopy: n/a     07/09/2024    8:36 AM 08/31/2022    2:20 PM 06/21/2022    9:05 AM  Depression screen PHQ 2/9  Decreased Interest  0 0  Down, Depressed, Hopeless 0 0 0  PHQ - 2 Score 0 0 0  Altered sleeping 2    Tired, decreased energy 2    Change in appetite 0    Feeling bad or failure about yourself  0    Trouble concentrating 0    Moving slowly or fidgety/restless 0    Suicidal thoughts 0    PHQ-9 Score 4    Difficult doing work/chores Not difficult at all          07/09/2024    8:36 AM  GAD 7 : Generalized Anxiety Score  Nervous, Anxious, on Edge 1  Control/stop worrying 0  Worry too much - different things 0  Trouble relaxing 1  Restless 0  Easily annoyed or irritable 2  Afraid - awful might happen 0  Total GAD 7 Score 4  Anxiety Difficulty Not difficult at all     Review of Systems:   Pertinent items are noted in HPI Denies any headaches, blurred vision, fatigue, shortness of breath, chest pain, abdominal pain, abnormal vaginal discharge/itching/odor/irritation, problems with periods, bowel movements, urination, or intercourse unless otherwise stated above. Pertinent History Reviewed:  Reviewed past medical,surgical, social and family history.  Reviewed problem list, medications and allergies. Physical Assessment:   Vitals:   07/09/24 0834   BP: 122/73  Pulse: 78  Weight: 135 lb 3.2 oz (61.3 kg)  Height: 5' 1.5 (1.562 m)  Body mass index is 25.13 kg/m.        Physical Examination:   General appearance - well appearing, and in no distress  Mental status - alert, oriented   Psych:  She has a normal mood and affect  Skin - warm and dry, normal color  Chest - effort normal, all lung fields clear to auscultation bilaterally  Heart - normal rate and regular rhythm  Neck:  midline trachea, no thyromegaly or nodules  Breasts - declines  Pelvic -deferred.  Extremities:  No swelling or varicosities noted  Chaperone present for exam  No results found for this or any previous visit (from the past 24 hours).  Assessment & Plan:  1. Encounter for annual routine gynecological examination (Primary) UTD on pap smear UTD on mammogram, encouraged self breast awareness Has PCP and recent lab work    2. Irregular periods 3. Encounter for initial prescription of contraceptive pills Discussed options such as birth control, also SSRI for cycle related mood changes She has tolerated birth control before and would like to try this.  Will trial lo loestrin  and discussed if breakthrough bleeding or irregular will do loestrin .  Partner had vasectomy  - LO LOESTRIN FE  1 MG-10 MCG /  10 MCG tablet; Take 1 tablet by mouth daily.  Dispense: 90 tablet; Refill: 3    Labs/procedures today:   Mammogram: in 1 year, or sooner if problems Colonoscopy: @ 40yo, or sooner if problems  No orders of the defined types were placed in this encounter.   Meds: No orders of the defined types were placed in this encounter.   Follow-up: Return in about 1 year (around 07/09/2025) for [place] Nidia Delores, FNP

## 2024-10-01 ENCOUNTER — Ambulatory Visit (HOSPITAL_BASED_OUTPATIENT_CLINIC_OR_DEPARTMENT_OTHER): Admitting: Obstetrics and Gynecology
